# Patient Record
Sex: Male | Born: 1980 | Race: White | Hispanic: No | Marital: Single | Smoking: Current every day smoker
Health system: Southern US, Community
[De-identification: ages and names within clinical notes are randomized; demographics above are authoritative.]

## PROBLEM LIST (undated history)

## (undated) DIAGNOSIS — Z789 Other specified health status: Secondary | ICD-10-CM

## (undated) HISTORY — PX: FRACTURE SURGERY: SHX138

---

## 2004-07-16 ENCOUNTER — Emergency Department: Payer: Self-pay | Admitting: Emergency Medicine

## 2004-07-29 ENCOUNTER — Emergency Department: Payer: Self-pay | Admitting: Emergency Medicine

## 2005-07-07 ENCOUNTER — Emergency Department: Payer: Self-pay | Admitting: General Practice

## 2005-11-18 ENCOUNTER — Emergency Department: Payer: Self-pay | Admitting: Emergency Medicine

## 2005-12-03 ENCOUNTER — Emergency Department: Payer: Self-pay | Admitting: Emergency Medicine

## 2006-04-01 ENCOUNTER — Emergency Department: Payer: Self-pay | Admitting: Emergency Medicine

## 2006-04-04 ENCOUNTER — Emergency Department: Payer: Self-pay | Admitting: Unknown Physician Specialty

## 2007-08-16 ENCOUNTER — Emergency Department: Payer: Self-pay | Admitting: Emergency Medicine

## 2007-12-24 ENCOUNTER — Emergency Department: Payer: Self-pay | Admitting: Emergency Medicine

## 2007-12-26 ENCOUNTER — Emergency Department: Payer: Self-pay | Admitting: Emergency Medicine

## 2009-01-24 ENCOUNTER — Emergency Department: Payer: Self-pay | Admitting: Internal Medicine

## 2009-01-31 ENCOUNTER — Emergency Department: Payer: Self-pay | Admitting: Internal Medicine

## 2009-06-11 ENCOUNTER — Emergency Department: Payer: Self-pay | Admitting: Emergency Medicine

## 2009-10-16 ENCOUNTER — Emergency Department: Payer: Self-pay | Admitting: Emergency Medicine

## 2009-10-18 ENCOUNTER — Emergency Department: Payer: Self-pay | Admitting: Unknown Physician Specialty

## 2010-08-16 ENCOUNTER — Emergency Department: Payer: Self-pay | Admitting: Emergency Medicine

## 2011-04-20 ENCOUNTER — Emergency Department: Payer: Self-pay | Admitting: Emergency Medicine

## 2011-04-20 LAB — CBC
HGB: 15.7 g/dL (ref 13.0–18.0)
MCH: 30.5 pg (ref 26.0–34.0)
MCHC: 33.4 g/dL (ref 32.0–36.0)
MCV: 91 fL (ref 80–100)
Platelet: 253 10*3/uL (ref 150–440)
RBC: 5.16 10*6/uL (ref 4.40–5.90)
RDW: 12.4 % (ref 11.5–14.5)
WBC: 8.2 10*3/uL (ref 3.8–10.6)

## 2011-04-20 LAB — COMPREHENSIVE METABOLIC PANEL
Anion Gap: 11 (ref 7–16)
BUN: 14 mg/dL (ref 7–18)
Calcium, Total: 9.1 mg/dL (ref 8.5–10.1)
Creatinine: 0.74 mg/dL (ref 0.60–1.30)
EGFR (African American): >60
Glucose: 113 mg/dL — ABNORMAL HIGH (ref 65–99)
Osmolality: 284 (ref 275–301)
Potassium: 4.2 mmol/L (ref 3.5–5.1)
Sodium: 142 mmol/L (ref 136–145)
Total Protein: 7.8 g/dL (ref 6.4–8.2)

## 2011-04-20 LAB — URINALYSIS, COMPLETE
Bacteria: NONE SEEN
Blood: NEGATIVE
Glucose,UR: NEGATIVE mg/dL (ref 0–75)
Ketone: NEGATIVE
Specific Gravity: 1.008 (ref 1.003–1.030)
Squamous Epithelial: NONE SEEN

## 2011-06-21 ENCOUNTER — Emergency Department: Payer: Self-pay | Admitting: Emergency Medicine

## 2011-07-25 ENCOUNTER — Emergency Department: Payer: Self-pay | Admitting: Emergency Medicine

## 2011-09-09 ENCOUNTER — Emergency Department: Payer: Self-pay | Admitting: Emergency Medicine

## 2011-09-09 ENCOUNTER — Ambulatory Visit: Payer: Self-pay | Admitting: Orthopaedic Surgery

## 2011-09-11 ENCOUNTER — Emergency Department: Payer: Self-pay | Admitting: Emergency Medicine

## 2011-11-21 ENCOUNTER — Emergency Department: Payer: Self-pay | Admitting: Emergency Medicine

## 2012-04-26 ENCOUNTER — Emergency Department: Payer: Self-pay | Admitting: Emergency Medicine

## 2012-04-26 LAB — CBC WITH DIFFERENTIAL/PLATELET
Basophil #: 0 10*3/uL (ref 0.0–0.1)
Lymphocyte #: 0.9 10*3/uL — ABNORMAL LOW (ref 1.0–3.6)
Lymphocyte %: 11.5 %
MCH: 31 pg (ref 26.0–34.0)
MCHC: 34.6 g/dL (ref 32.0–36.0)
Neutrophil #: 6.2 10*3/uL (ref 1.4–6.5)
Neutrophil %: 80.7 %
Platelet: 161 10*3/uL (ref 150–440)
WBC: 7.7 10*3/uL (ref 3.8–10.6)

## 2012-04-26 LAB — COMPREHENSIVE METABOLIC PANEL
Albumin: 3.8 g/dL (ref 3.4–5.0)
Alkaline Phosphatase: 88 U/L (ref 50–136)
Anion Gap: 5 — ABNORMAL LOW (ref 7–16)
BUN: 11 mg/dL (ref 7–18)
Calcium, Total: 8.4 mg/dL — ABNORMAL LOW (ref 8.5–10.1)
Chloride: 104 mmol/L (ref 98–107)
Co2: 28 mmol/L (ref 21–32)
EGFR (Non-African Amer.): >60
Osmolality: 273 (ref 275–301)
Potassium: 3.8 mmol/L (ref 3.5–5.1)
SGOT(AST): 22 U/L (ref 15–37)
SGPT (ALT): 27 U/L (ref 12–78)
Sodium: 137 mmol/L (ref 136–145)
Total Protein: 7 g/dL (ref 6.4–8.2)

## 2012-04-26 LAB — PROTIME-INR
INR: 1.1
Prothrombin Time: 14.1 secs (ref 11.5–14.7)

## 2012-05-08 ENCOUNTER — Emergency Department: Payer: Self-pay | Admitting: Emergency Medicine

## 2012-07-09 ENCOUNTER — Emergency Department: Payer: Self-pay | Admitting: Emergency Medicine

## 2013-05-06 ENCOUNTER — Emergency Department: Payer: Self-pay | Admitting: Emergency Medicine

## 2013-05-06 LAB — URINALYSIS, COMPLETE
BACTERIA: NONE SEEN
Bilirubin,UR: NEGATIVE
Blood: NEGATIVE
Glucose,UR: NEGATIVE mg/dL (ref 0–75)
Ketone: NEGATIVE
LEUKOCYTE ESTERASE: NEGATIVE
NITRITE: NEGATIVE
PH: 7 (ref 4.5–8.0)
Protein: NEGATIVE
RBC, UR: NONE SEEN /HPF (ref 0–5)
SPECIFIC GRAVITY: 1.023 (ref 1.003–1.030)
WBC UR: NONE SEEN /HPF (ref 0–5)

## 2013-06-12 ENCOUNTER — Emergency Department: Payer: Self-pay | Admitting: Emergency Medicine

## 2013-06-12 LAB — URINALYSIS, COMPLETE
BACTERIA: NONE SEEN
BILIRUBIN, UR: NEGATIVE
Blood: NEGATIVE
GLUCOSE, UR: NEGATIVE mg/dL (ref 0–75)
Ketone: NEGATIVE
Nitrite: NEGATIVE
PROTEIN: NEGATIVE
Ph: 5 (ref 4.5–8.0)
RBC,UR: 1 /HPF (ref 0–5)
Specific Gravity: 1.013 (ref 1.003–1.030)
Squamous Epithelial: 1
WBC UR: 1 /HPF (ref 0–5)

## 2013-08-19 ENCOUNTER — Emergency Department: Payer: Self-pay | Admitting: Emergency Medicine

## 2013-08-19 LAB — COMPREHENSIVE METABOLIC PANEL
ALBUMIN: 3.7 g/dL (ref 3.4–5.0)
AST: 11 U/L — AB (ref 15–37)
Alkaline Phosphatase: 70 U/L
Anion Gap: 9 (ref 7–16)
BUN: 12 mg/dL (ref 7–18)
Bilirubin,Total: 0.3 mg/dL (ref 0.2–1.0)
CALCIUM: 8.5 mg/dL (ref 8.5–10.1)
CHLORIDE: 108 mmol/L — AB (ref 98–107)
Co2: 27 mmol/L (ref 21–32)
Creatinine: 0.85 mg/dL (ref 0.60–1.30)
EGFR (African American): >60
GLUCOSE: 95 mg/dL (ref 65–99)
OSMOLALITY: 286 (ref 275–301)
POTASSIUM: 3.4 mmol/L — AB (ref 3.5–5.1)
SGPT (ALT): 21 U/L (ref 12–78)
Sodium: 144 mmol/L (ref 136–145)
TOTAL PROTEIN: 6.8 g/dL (ref 6.4–8.2)

## 2013-08-19 LAB — CBC WITH DIFFERENTIAL/PLATELET
BASOS ABS: 0.1 10*3/uL (ref 0.0–0.1)
BASOS PCT: 1.1 %
EOS ABS: 0.4 10*3/uL (ref 0.0–0.7)
Eosinophil %: 3.4 %
HCT: 40.9 % (ref 40.0–52.0)
HGB: 13.6 g/dL (ref 13.0–18.0)
Lymphocyte #: 4.8 10*3/uL — ABNORMAL HIGH (ref 1.0–3.6)
Lymphocyte %: 41.6 %
MCH: 30.1 pg (ref 26.0–34.0)
MCHC: 33.2 g/dL (ref 32.0–36.0)
MCV: 91 fL (ref 80–100)
MONO ABS: 0.9 x10 3/mm (ref 0.2–1.0)
Monocyte %: 7.7 %
NEUTROS PCT: 46.2 %
Neutrophil #: 5.3 10*3/uL (ref 1.4–6.5)
Platelet: 244 10*3/uL (ref 150–440)
RBC: 4.51 10*6/uL (ref 4.40–5.90)
RDW: 13.8 % (ref 11.5–14.5)
WBC: 11.5 10*3/uL — AB (ref 3.8–10.6)

## 2013-10-16 ENCOUNTER — Encounter (HOSPITAL_COMMUNITY): Payer: Self-pay | Admitting: Emergency Medicine

## 2013-10-16 ENCOUNTER — Emergency Department (HOSPITAL_COMMUNITY)
Admission: EM | Admit: 2013-10-16 | Discharge: 2013-10-16 | Disposition: A | Discharge status: Home / Self Care | Payer: Medicaid Other | Attending: Emergency Medicine | Admitting: Emergency Medicine

## 2013-10-16 ENCOUNTER — Emergency Department (HOSPITAL_COMMUNITY): Payer: Medicaid Other

## 2013-10-16 DIAGNOSIS — M7989 Other specified soft tissue disorders: Secondary | ICD-10-CM | POA: Diagnosis not present

## 2013-10-16 DIAGNOSIS — G8929 Other chronic pain: Secondary | ICD-10-CM | POA: Insufficient documentation

## 2013-10-16 DIAGNOSIS — F17200 Nicotine dependence, unspecified, uncomplicated: Secondary | ICD-10-CM | POA: Insufficient documentation

## 2013-10-16 DIAGNOSIS — M25569 Pain in unspecified knee: Secondary | ICD-10-CM | POA: Insufficient documentation

## 2013-10-16 DIAGNOSIS — M79609 Pain in unspecified limb: Secondary | ICD-10-CM

## 2013-10-16 DIAGNOSIS — M25561 Pain in right knee: Secondary | ICD-10-CM

## 2013-10-16 MED ORDER — OXYCODONE-ACETAMINOPHEN 5-325 MG PO TABS
2.0000 | ORAL_TABLET | Freq: Once | ORAL | Status: AC
  Administered 2013-10-16: 2 via ORAL
  Filled 2013-10-16: qty 2

## 2013-10-16 MED ORDER — OXYCODONE-ACETAMINOPHEN 5-325 MG PO TABS: 1.0000 | ORAL_TABLET | ORAL | Status: DC | PRN

## 2013-10-16 MED ORDER — ONDANSETRON HCL 4 MG PO TABS: 4.0000 mg | ORAL_TABLET | Freq: Four times a day (QID) | ORAL | Status: DC

## 2013-10-16 MED ORDER — ONDANSETRON 4 MG PO TBDP
8.0000 mg | ORAL_TABLET | Freq: Once | ORAL | Status: AC
  Administered 2013-10-16: 8 mg via ORAL
  Filled 2013-10-16: qty 2

## 2013-10-16 NOTE — Progress Notes (Signed)
VASCULAR LAB PRELIMINARY  PRELIMINARY  PRELIMINARY  PRELIMINARY  Right lower extremity venous duplex completed.    Preliminary report: Right:  No evidence of DVT, superficial thrombosis, or Baker's cyst.  Carmie Lanpher, RVS 10/16/2013, 4:15 PM

## 2013-10-16 NOTE — ED Notes (Signed)
Declined W/C at D/C and was escorted to lobby by RN. 

## 2013-10-16 NOTE — Discharge Instructions (Signed)
Edema Edema is an abnormal buildup of fluids. It is more common in your legs and thighs. Painless swelling of the feet and ankles is more likely as a person ages. It also is common in looser skin, like around your eyes. HOME CARE   Keep the affected body part above the level of the heart while lying down.  Do not sit still or stand for a long time.  Do not put anything right under your knees when you lie down.  Do not wear tight clothes on your upper legs.  Exercise your legs to help the puffiness (swelling) go down.  Wear elastic bandages or support stockings as told by your doctor.  A low-salt diet may help lessen the puffiness.  Only take medicine as told by your doctor. GET HELP IF:  Treatment is not working.  You have heart, liver, or kidney disease and notice that your skin looks puffy or shiny.  You have puffiness in your legs that does not get better when you raise your legs.  You have sudden weight gain for no reason. GET HELP RIGHT AWAY IF:   You have shortness of breath or chest pain.  You cannot breathe when you lie down.  You have pain, redness, or warmth in the areas that are puffy.  You have heart, liver, or kidney disease and get edema all of a sudden.  You have a fever and your symptoms get worse all of a sudden. MAKE SURE YOU:   Understand these instructions.  Will watch your condition.  Will get help right away if you are not doing well or get worse. Document Released: 07/06/2007 Document Revised: 01/22/2013 Document Reviewed: 11/09/2012 [facility] Patient Information 2015 [place], [place]. This information is not intended to replace advice given to you by your health care provider. Make sure you discuss any questions you have with your health care provider.  Knee Pain The knee is the complex joint between your thigh and your lower leg. It is made up of bones, tendons, ligaments, and cartilage. The bones that make up the knee are:  The femur in the  thigh.  The tibia and fibula in the lower leg.  The patella or kneecap riding in the groove on the lower femur. CAUSES  Knee pain is a common complaint with many causes. A few of these causes are:  Injury, such as:  A ruptured ligament or tendon injury.  Torn cartilage.  Medical conditions, such as:  Gout  Arthritis  Infections  Overuse, over training, or overdoing a physical activity. Knee pain can be minor or severe. Knee pain can accompany debilitating injury. Minor knee problems often respond well to self-care measures or get well on their own. More serious injuries may need medical intervention or even surgery. SYMPTOMS The knee is complex. Symptoms of knee problems can vary widely. Some of the problems are:  Pain with movement and weight bearing.  Swelling and tenderness.  Buckling of the knee.  Inability to straighten or extend your knee.  Your knee locks and you cannot straighten it.  Warmth and redness with pain and fever.  Deformity or dislocation of the kneecap. DIAGNOSIS  Determining what is wrong may be very straight forward such as when there is an injury. It can also be challenging because of the complexity of the knee. Tests to make a diagnosis may include:  Your caregiver taking a history and doing a physical exam.  Routine X-rays can be used to rule out other problems. X-rays  will not reveal a cartilage tear. Some injuries of the knee can be diagnosed by:  Arthroscopy a surgical technique by which a small video camera is inserted through tiny incisions on the sides of the knee. This procedure is used to examine and repair internal knee joint problems. Tiny instruments can be used during arthroscopy to repair the torn knee cartilage (meniscus).  Arthrography is a radiology technique. A contrast liquid is directly injected into the knee joint. Internal structures of the knee joint then become visible on X-ray film.  An MRI scan is a non X-ray  radiology procedure in which magnetic fields and a computer produce two- or three-dimensional images of the inside of the knee. Cartilage tears are often visible using an MRI scanner. MRI scans have largely replaced arthrography in diagnosing cartilage tears of the knee.  Blood work.  Examination of the fluid that helps to lubricate the knee joint (synovial fluid). This is done by taking a sample out using a needle and a syringe. TREATMENT The treatment of knee problems depends on the cause. Some of these treatments are:  Depending on the injury, proper casting, splinting, surgery, or physical therapy care will be needed.  Give yourself adequate recovery time. Do not overuse your joints. If you begin to get sore during workout routines, back off. Slow down or do fewer repetitions.  For repetitive activities such as cycling or running, maintain your strength and nutrition.  Alternate muscle groups. For example, if you are a weight lifter, work the upper body on one day and the lower body the next.  Either tight or weak muscles do not give the proper support for your knee. Tight or weak muscles do not absorb the stress placed on the knee joint. Keep the muscles surrounding the knee strong.  Take care of mechanical problems.  If you have flat feet, orthotics or special shoes may help. See your caregiver if you need help.  Arch supports, sometimes with wedges on the inner or outer aspect of the heel, can help. These can shift pressure away from the side of the knee most bothered by osteoarthritis.  A brace called an "unloader" brace also may be used to help ease the pressure on the most arthritic side of the knee.  If your caregiver has prescribed crutches, braces, wraps or ice, use as directed. The acronym for this is PRICE. This means protection, rest, ice, compression, and elevation.  Nonsteroidal anti-inflammatory drugs (NSAIDs), can help relieve pain. But if taken immediately after an  injury, they may actually increase swelling. Take NSAIDs with food in your stomach. Stop them if you develop stomach problems. Do not take these if you have a history of ulcers, stomach pain, or bleeding from the bowel. Do not take without your caregiver's approval if you have problems with fluid retention, heart failure, or kidney problems.  For ongoing knee problems, physical therapy may be helpful.  Glucosamine and chondroitin are over-the-counter dietary supplements. Both may help relieve the pain of osteoarthritis in the knee. These medicines are different from the usual anti-inflammatory drugs. Glucosamine may decrease the rate of cartilage destruction.  Injections of a corticosteroid drug into your knee joint may help reduce the symptoms of an arthritis flare-up. They may provide pain relief that lasts a few months. You may have to wait a few months between injections. The injections do have a small increased risk of infection, water retention, and elevated blood sugar levels.  Hyaluronic acid injected into damaged joints may ease  pain and provide lubrication. These injections may work by reducing inflammation. A series of shots may give relief for as long as 6 months.  Topical painkillers. Applying certain ointments to your skin may help relieve the pain and stiffness of osteoarthritis. Ask your pharmacist for suggestions. Many over the-counter products are approved for temporary relief of arthritis pain.  In some countries, doctors often prescribe topical NSAIDs for relief of chronic conditions such as arthritis and tendinitis. A review of treatment with NSAID creams found that they worked as well as oral medications but without the serious side effects. PREVENTION  Maintain a healthy weight. Extra pounds put more strain on your joints.  Get strong, stay limber. Weak muscles are a common cause of knee injuries. Stretching is important. Include flexibility exercises in your workouts.  Be  smart about exercise. If you have osteoarthritis, chronic knee pain or recurring injuries, you may need to change the way you exercise. This does not mean you have to stop being active. If your knees ache after jogging or playing basketball, consider switching to swimming, water aerobics, or other low-impact activities, at least for a few days a week. Sometimes limiting high-impact activities will provide relief.  Make sure your shoes fit well. Choose footwear that is right for your sport.  Protect your knees. Use the proper gear for knee-sensitive activities. Use kneepads when playing volleyball or laying carpet. Buckle your seat belt every time you drive. Most shattered kneecaps occur in car accidents.  Rest when you are tired. SEEK MEDICAL CARE IF:  You have knee pain that is continual and does not seem to be getting better.  SEEK IMMEDIATE MEDICAL CARE IF:  Your knee joint feels hot to the touch and you have a high fever. MAKE SURE YOU:   Understand these instructions.  Will watch your condition.  Will get help right away if you are not doing well or get worse. Document Released: 11/14/2006 Document Revised: 04/11/2011 Document Reviewed: 11/14/2006 [facility] Patient Information 2015 [place], [place]. This information is not intended to replace advice given to you by your health care provider. Make sure you discuss any questions you have with your health care provider.

## 2013-10-16 NOTE — ED Notes (Signed)
Pt reports hx of injury to right knee, return of pain and swelling one week ago. No relief with ibuprofen.

## 2013-10-16 NOTE — ED Provider Notes (Signed)
CSN: [id number]     Arrival date & time 10/16/13  1143 History  This chart was scribed for non-physician practitioner, Mora Bellman, PA-C, working with Audree Camel, MD by Charline Bills, ED Scribe. This patient was seen in room TR05C/TR05C and the patient's care was started at 2:02 PM.   Chief Complaint  Patient presents with  . Knee Pain   The history is provided by the patient. No language interpreter was used.   HPI Comments: Joe Laser. is a 33 y.o. male who presents to the Emergency Department complaining of R knee pain with associated swelling onset 1 week ago. Pt reports R meniscus injury 4 years again but he never followed up. He describes the pain as throbbing. Walking makes his pain worse. He reports associated R foot swelling first noted 2 days ago. He denies re-injury. He also denies chest pain and SOB. No h/o blood clot.  History reviewed. No pertinent past medical history. History reviewed. No pertinent past surgical history. History reviewed. No pertinent family history. History  Substance Use Topics  . Smoking status: Current Every Day Smoker    Types: Cigarettes  . Smokeless tobacco: Not on file  . Alcohol Use: Yes    Review of Systems  Respiratory: Negative for shortness of breath.   Cardiovascular: Positive for chest pain (intermittent) and leg swelling.  Musculoskeletal: Positive for arthralgias.  All other systems reviewed and are negative.  Allergies  Tramadol  Home Medications   Prior to Admission medications   Not on File   Triage Vitals: BP 127/85  Pulse 60  Temp(Src) 98.4 F (36.9 C) (Oral)  Resp 18  SpO2 98% Physical Exam  Nursing note and vitals reviewed. Constitutional: He is oriented to person, place, and time. He appears well-developed and well-nourished. No distress.  HENT:  Head: Normocephalic and atraumatic.  Right Ear: External ear normal.  Left Ear: External ear normal.  Nose: Nose normal.  Eyes: Conjunctivae are  normal.  Neck: Normal range of motion. No tracheal deviation present.  Cardiovascular: Normal rate, regular rhythm and normal heart sounds.   Pulses:      Posterior tibial pulses are 2+ on the right side.  Pulmonary/Chest: Effort normal and breath sounds normal. No stridor.  Abdominal: Soft. He exhibits no distension. There is no tenderness.  Musculoskeletal: Normal range of motion.  Swelling to R lower extremity  Tender to palpation to posterior calf and posterior knee Joint stable. Compartments soft, neurovascularly intact.   Neurological: He is alert and oriented to person, place, and time.  Skin: Skin is warm and dry. He is not diaphoretic.  Psychiatric: He has a normal mood and affect. His behavior is normal.   ED Course  Procedures (including critical care time) DIAGNOSTIC STUDIES: Oxygen Saturation is 98% on RA, normal by my interpretation.    COORDINATION OF CARE: 2:06 PM-Discussed treatment plan which includes XR, [place] and medication for pain with pt at bedside and pt agreed to plan.   Labs Review Labs Reviewed - No data to display  Imaging Review Dg Knee Complete 4 Views Right  10/16/2013   CLINICAL DATA:  Medial right knee pain; history of old injury  EXAM: RIGHT KNEE - COMPLETE 4+ VIEW  COMPARISON:  None.  FINDINGS: The bones of the right knee are adequately mineralized. There is no acute fracture nor dislocation. There is mild beaking of the tibial spines. The joint spaces are well maintained. The patella is normally positioned. There is no joint  effusion.  IMPRESSION: There is no acute bony abnormality of the right knee. Mild degenerative change is present.   Electronically Signed   By: David  [place]   On: 10/16/2013 13:44    EKG Interpretation None      MDM   Final diagnoses:  Right knee pain  Right leg swelling    Patient presents to ED with right knee pain and swelling. No known injury to provoke swelling. Patient does have known meniscus tear. Patient with  normal XR and negative vascular ultrasound. No DVT. Patient likely has injury that he does not remember causing irritation of his meniscus. Patient was given knee sleeve and orthopedic follow up. Discussed reasons to return to ED immediately. Vital signs stable for discharge. Patient / Family / Caregiver informed of clinical course, understand medical decision-making process, and agree with plan.   I personally performed the services described in this documentation, which was scribed in my presence. The recorded information has been reviewed and is accurate.    Mora Bellman, PA-C 10/17/13 [phone]

## 2013-10-19 NOTE — ED Provider Notes (Signed)
Medical screening examination/treatment/procedure(s) were performed by non-physician practitioner and as supervising physician I was immediately available for consultation/collaboration.  Antonin Meininger T Graylen [place], MD 10/19/13 0751 

## 2014-05-20 NOTE — H&P (Signed)
    Subjective/Chief Complaint Right hand pain    History of Present Illness 34 year old male who struck a wall with his right hand and presented to the ED with pain and deformity of the ulnar border of the right hand.  He had no other injuries and no other complaints.  He describes the pain as intense 9/10 and throbbing.    Past History prior pinning of Left 5th Metacarpal fracture   Past Med/Surgical Hx:  Denies medical history:   ALLERGIES:  Toradol: Rash  Tramadol: Hives  Family and Social History:   Family History Non-Contributory    Social History positive  tobacco    + Tobacco Current (within 1 year)    Place of Living Home   Review of Systems:   Subjective/Chief Complaint Right hand pain    Fever/Chills No    Cough No    Sputum No    Abdominal Pain No    Diarrhea No    Constipation No    Nausea/Vomiting No    SOB/DOE No    Chest Pain No    Dysuria No    Tolerating PT No    Tolerating Diet Yes    Medications/Allergies Reviewed Medications/Allergies reviewed   Physical Exam:   GEN no acute distress    HEENT PERRL    NECK supple    RESP normal resp effort    CARD regular rate    EXTR negative cyanosis/clubbing    SKIN skin turgor good    NEURO follows commands    PSYCH A+O to time, place, person    Additional Comments R hand with localized swelling at ulnar border of the hand. No abrasion or laceration Tenderness to palpation over the area of swelling Sensation intact to light touch radial and ulnar border of the 5th digit cap refill < 2sec, warm and well perfused dificult to asses motor function in the 5th digit due to pain and swelling that restrict ROM but appears to have active flex and ext at the PIP and DIP articulcations ROM and scissoring can not be fully evaluated due to patient's inability or unwillingness to attempt full flexion at the MCP joint.  no gross rotational deformity noted.  XR noted to have R 5th MC fracture  with apex dorsal angulation, no apparent articular involvement     Assessment/Admission Diagnosis 34 yr old male with closed Right 5th MC fracture    Plan Discussion with the patient regarding likely cosmetic and potential functional benefit from closed reduction and splinting but the patient has deferred closed reduction at this time despite expressing understanding that if he desires manipulation to correct the overall alignment at a later date, it will likely be more involved and may need operative intervention if he is found to have excessive deformity or rotational deformity that limits appropriate hand function.  The patient has chosen to undergo splinting without closed reduction at this time and will followup with Dr. Ernest PineHooten in approximately one week for further evaluation and treatment as warranted at that time.  Pain control with oral pain meds per ED recommedations NWB to RUE in the splint.   Electronic Signatures: Otilio SaberSikes, Maddix Kliewer V (MD)  (Signed 09-Aug-13 19:52)  Authored: CHIEF COMPLAINT and HISTORY, PAST MEDICAL/SURGIAL HISTORY, ALLERGIES, FAMILY AND SOCIAL HISTORY, REVIEW OF SYSTEMS, PHYSICAL EXAM, ASSESSMENT AND PLAN   Last Updated: 09-Aug-13 19:52 by Otilio SaberSikes, Gianni Mihalik V (MD)

## 2014-06-10 ENCOUNTER — Emergency Department
Admission: EM | Admit: 2014-06-10 | Discharge: 2014-06-10 | Disposition: A | Discharge status: Home / Self Care | Payer: Medicaid Other | Attending: Emergency Medicine | Admitting: Emergency Medicine

## 2014-06-10 ENCOUNTER — Encounter: Payer: Self-pay | Admitting: *Deleted

## 2014-06-10 ENCOUNTER — Emergency Department: Payer: Medicaid Other

## 2014-06-10 DIAGNOSIS — S299XXA Unspecified injury of thorax, initial encounter: Secondary | ICD-10-CM | POA: Insufficient documentation

## 2014-06-10 DIAGNOSIS — Y998 Other external cause status: Secondary | ICD-10-CM | POA: Insufficient documentation

## 2014-06-10 DIAGNOSIS — Y9289 Other specified places as the place of occurrence of the external cause: Secondary | ICD-10-CM | POA: Diagnosis not present

## 2014-06-10 DIAGNOSIS — Y9389 Activity, other specified: Secondary | ICD-10-CM | POA: Diagnosis not present

## 2014-06-10 DIAGNOSIS — M546 Pain in thoracic spine: Secondary | ICD-10-CM

## 2014-06-10 DIAGNOSIS — Z720 Tobacco use: Secondary | ICD-10-CM | POA: Diagnosis not present

## 2014-06-10 MED ORDER — CYCLOBENZAPRINE HCL 5 MG PO TABS: 5.0000 mg | ORAL_TABLET | Freq: Three times a day (TID) | ORAL | Status: DC | PRN

## 2014-06-10 MED ORDER — CYCLOBENZAPRINE HCL 10 MG PO TABS
5.0000 mg | ORAL_TABLET | Freq: Once | ORAL | Status: AC
  Administered 2014-06-10: 5 mg via ORAL

## 2014-06-10 MED ORDER — CYCLOBENZAPRINE HCL 10 MG PO TABS
ORAL_TABLET | ORAL | Status: AC
  Administered 2014-06-10: 5 mg via ORAL
  Filled 2014-06-10: qty 1
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
## 2014-06-10 NOTE — Discharge Instructions (Signed)
Please seek medical attention for any high fevers, chest pain, shortness of breath, change in behavior, persistent vomiting, bloody stool or any other new or concerning symptoms. ° ° °Back Pain, Adult °Low back pain is very common. About 1 in 5 people have back pain. The cause of low back pain is rarely dangerous. The pain often gets better over time. About half of people with a sudden onset of back pain feel better in just 2 weeks. About 8 in 10 people feel better by 6 weeks.  °CAUSES °Some common causes of back pain include: °· Strain of the muscles or ligaments supporting the spine. °· Wear and tear (degeneration) of the spinal discs. °· Arthritis. °· Direct injury to the back. °DIAGNOSIS °Most of the time, the direct cause of low back pain is not known. However, back pain can be treated effectively even when the exact cause of the pain is unknown. Answering your caregiver's questions about your overall health and symptoms is one of the most accurate ways to make sure the cause of your pain is not dangerous. If your caregiver needs more information, he or she may order lab work or imaging tests (X-rays or MRIs). However, even if imaging tests show changes in your back, this usually does not require surgery. °HOME CARE INSTRUCTIONS °For many people, back pain returns. Since low back pain is rarely dangerous, it is often a condition that people can learn to manage on their own.  °· Remain active. It is stressful on the back to sit or stand in one place. Do not sit, drive, or stand in one place for more than 30 minutes at a time. Take short walks on level surfaces as soon as pain allows. Try to increase the length of time you walk each day. °· Do not stay in bed. Resting more than 1 or 2 days can delay your recovery. °· Do not avoid exercise or work. Your body is made to move. It is not dangerous to be active, even though your back may hurt. Your back will likely heal faster if you return to being active before your  pain is gone. °· Pay attention to your body when you  bend and lift. Many people have less discomfort when lifting if they bend their knees, keep the load close to their bodies, and avoid twisting. Often, the most comfortable positions are those that put less stress on your recovering back. °· Find a comfortable position to sleep. Use a firm mattress and lie on your side with your knees slightly bent. If you lie on your back, put a pillow under your knees. °· Only take over-the-counter or prescription medicines as directed by your caregiver. Over-the-counter medicines to reduce pain and inflammation are often the most helpful. Your caregiver may prescribe muscle relaxant drugs. These medicines help dull your pain so you can more quickly return to your normal activities and healthy exercise. °· Put ice on the injured area. °¨ Put ice in a plastic bag. °¨ Place a towel between your skin and the bag. °¨ Leave the ice on for 15-20 minutes, 03-04 times a day for the first 2 to 3 days. After that, ice and heat may be alternated to reduce pain and spasms. °· Ask your caregiver about trying back exercises and gentle massage. This may be of some benefit. °· Avoid feeling anxious or stressed. Stress increases muscle tension and can worsen back pain. It is important to recognize when you are anxious or stressed and learn ways to manage it. Exercise is a great option. °SEEK MEDICAL CARE IF: °· You have pain that is not   relieved with rest or medicine. °· You have pain that does not improve in 1 week. °· You have new symptoms. °· You are generally not feeling well. °SEEK IMMEDIATE MEDICAL CARE IF:  °· You have pain that radiates from your back into your legs. °· You develop new bowel or bladder control problems. °· You have unusual weakness or numbness in your arms or legs. °· You develop nausea or vomiting. °· You develop abdominal pain. °· You feel faint. °Document Released: 01/17/2005 Document Revised: 07/19/2011 Document  Reviewed: 05/21/2013 °[employer]® Patient Information ©2015 [employer]. This information is not intended to replace advice given to you by your health care provider. Make sure you discuss any questions you have with your health care provider. ° °
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
## 2014-09-15 ENCOUNTER — Encounter: Payer: Self-pay | Admitting: *Deleted

## 2014-09-15 ENCOUNTER — Emergency Department
Admission: EM | Admit: 2014-09-15 | Discharge: 2014-09-15 | Disposition: A | Discharge status: Home / Self Care | Payer: Medicaid Other | Attending: Emergency Medicine | Admitting: Emergency Medicine

## 2014-09-15 DIAGNOSIS — W57XXXA Bitten or stung by nonvenomous insect and other nonvenomous arthropods, initial encounter: Secondary | ICD-10-CM | POA: Diagnosis not present

## 2014-09-15 DIAGNOSIS — S80862A Insect bite (nonvenomous), left lower leg, initial encounter: Secondary | ICD-10-CM | POA: Diagnosis not present

## 2014-09-15 DIAGNOSIS — Z79899 Other long term (current) drug therapy: Secondary | ICD-10-CM | POA: Insufficient documentation

## 2014-09-15 DIAGNOSIS — Y9389 Activity, other specified: Secondary | ICD-10-CM | POA: Insufficient documentation

## 2014-09-15 DIAGNOSIS — Y9289 Other specified places as the place of occurrence of the external cause: Secondary | ICD-10-CM | POA: Insufficient documentation

## 2014-09-15 DIAGNOSIS — Y998 Other external cause status: Secondary | ICD-10-CM | POA: Insufficient documentation

## 2014-09-15 DIAGNOSIS — L089 Local infection of the skin and subcutaneous tissue, unspecified: Secondary | ICD-10-CM | POA: Insufficient documentation

## 2014-09-15 DIAGNOSIS — Z720 Tobacco use: Secondary | ICD-10-CM | POA: Insufficient documentation

## 2014-09-15 MED ORDER — OXYCODONE-ACETAMINOPHEN 5-325 MG PO TABS: 1.0000 | ORAL_TABLET | Freq: Four times a day (QID) | ORAL | Status: DC | PRN

## 2014-09-15 MED ORDER — IBUPROFEN 800 MG PO TABS
800.0000 mg | ORAL_TABLET | Freq: Once | ORAL | Status: AC
  Administered 2014-09-15: 800 mg via ORAL
  Filled 2014-09-15: qty 1

## 2014-09-15 MED ORDER — IBUPROFEN 800 MG PO TABS: 800.0000 mg | ORAL_TABLET | Freq: Three times a day (TID) | ORAL | Status: AC | PRN

## 2014-09-15 MED ORDER — SULFAMETHOXAZOLE-TRIMETHOPRIM 800-160 MG PO TABS: 1.0000 | ORAL_TABLET | Freq: Two times a day (BID) | ORAL | Status: DC

## 2014-09-15 MED ORDER — OXYCODONE-ACETAMINOPHEN 5-325 MG PO TABS
1.0000 | ORAL_TABLET | Freq: Once | ORAL | Status: AC
  Administered 2014-09-15: 1 via ORAL
  Filled 2014-09-15: qty 1

## 2014-09-15 NOTE — ED Notes (Signed)
Past few days cc/o small abcess to area below left knee

## 2014-09-15 NOTE — ED Provider Notes (Signed)
[facility] Emergency Department Provider Note  ____________________________________________  Time seen: Approximately 12:37 PM  I have reviewed the triage vital signs and the nursing notes.   HISTORY  Chief Complaint Abscess    HPI Joe Lang. is a 34 y.o. male complaining of pain edema and erythema just below the left knee. Patient believe is secondary to an insect bite. Patient denies any fevers chills associated with this complaint. Patient denies any nausea vomiting or diarrhea. Patient rated his pain discomfort as as a 7/10. No palliative measures taken for this complaint.   History reviewed. No pertinent past medical history.  There are no active problems to display for this patient.   History reviewed. No pertinent past surgical history.  Current Outpatient Rx  Name  Route  Sig  Dispense  Refill  . cyclobenzaprine (FLEXERIL) 5 MG tablet   Oral   Take 1 tablet (5 mg total) by mouth every 8 (eight) hours as needed for muscle spasms.   20 tablet   0   . ibuprofen (ADVIL,MOTRIN) 200 MG tablet   Oral   Take 600 mg by mouth every 6 (six) hours as needed.         Marland Kitchen ibuprofen (ADVIL,MOTRIN) 800 MG tablet   Oral   Take 1 tablet (800 mg total) by mouth every 8 (eight) hours as needed for moderate pain.   15 tablet   0   . ondansetron (ZOFRAN) 4 MG tablet   Oral   Take 1 tablet (4 mg total) by mouth every 6 (six) hours.   12 tablet   0   . oxyCODONE-acetaminophen (PERCOCET/ROXICET) 5-325 MG per tablet   Oral   Take 1 tablet by mouth every 4 (four) hours as needed for severe pain. May take 2 tablets PO q 6 hours for severe pain - Do not take with Tylenol as this tablet already contains tylenol   15 tablet   0   . oxyCODONE-acetaminophen (ROXICET) 5-325 MG per tablet   Oral   Take 1 tablet by mouth every 6 (six) hours as needed for moderate pain.   8 tablet   0   . sulfamethoxazole-trimethoprim (BACTRIM DS,SEPTRA DS) 800-160  MG per tablet   Oral   Take 1 tablet by mouth 2 (two) times daily.   20 tablet   0     Allergies Tramadol  No family history on file.  Social History Social History  Substance Use Topics  . Smoking status: Current Every Day Smoker    Types: Cigarettes  . Smokeless tobacco: None  . Alcohol Use: No    Review of Systems Constitutional: No fever/chills Eyes: No visual changes. ENT: No sore throat. Cardiovascular: Denies chest pain. Respiratory: Denies shortness of breath. Gastrointestinal: No abdominal pain.  No nausea, no vomiting.  No diarrhea.  No constipation. Genitourinary: Negative for dysuria. Musculoskeletal: Negative for back pain. Skin: Edema erythema left lower leg. Neurological: Negative for headaches, focal weakness or numbness. 10-point ROS otherwise negative.  ____________________________________________   PHYSICAL EXAM:  VITAL SIGNS: ED Triage Vitals  Enc Vitals Group     BP 09/15/14 1140 131/97 mmHg     Pulse Rate 09/15/14 1140 76     Resp 09/15/14 1140 20     Temp 09/15/14 1140 98.4 F (36.9 C)     Temp Source 09/15/14 1140 Oral     SpO2 09/15/14 1140 100 %     Weight 09/15/14 1140 152 lb (68.947 kg)  Height 09/15/14 1140  (1.727 m)     Head Cir --      Peak Flow --      Pain Score 09/15/14 1142 7     Pain Loc --      Pain Edu? --      Excl. in GC? --     Constitutional: Alert and oriented. Well appearing and in no acute distress. Eyes: Conjunctivae are normal. PERRL. EOMI. Head: Atraumatic. Nose: No congestion/rhinnorhea. Mouth/Throat: Mucous membranes are moist.  Oropharynx non-erythematous. Neck: No stridor.  No cervical spine tenderness to palpation. Hematological/Lymphatic/Immunilogical: No cervical lymphadenopathy. Cardiovascular: Normal rate, regular rhythm. Grossly normal heart sounds.  Good peripheral circulation. Respiratory: Normal respiratory effort.  No retractions. Lungs CTAB. Gastrointestinal: Soft and  nontender. No distention. No abdominal bruits. No CVA tenderness. Musculoskeletal: No lower extremity tenderness nor edema.  No joint effusions. Neurologic:  Normal speech and language. No gross focal neurologic deficits are appreciated. No gait instability. Skin: Papular lesion on erythematous base. No discharge. Area is nonfluctuant. Psychiatric: Mood and affect are normal. Speech and behavior are normal.  ____________________________________________   LABS (all labs ordered are listed, but only abnormal results are displayed)  Labs Reviewed - No data to display ____________________________________________  EKG   ____________________________________________  RADIOLOGY   ____________________________________________   PROCEDURES  Procedure(s) performed: None  Critical Care performed: No  ____________________________________________   INITIAL IMPRESSION / ASSESSMENT AND PLAN / ED COURSE  Pertinent labs & imaging results that were available during my care of the patient were reviewed by me and considered in my medical decision making (see chart for details).  Skin infection secondary to insect bite. ____________________________________________   FINAL CLINICAL IMPRESSION(S) / ED DIAGNOSES  Final diagnoses:  Infected insect bite of left leg, initial encounter      Joni Reining, PA-C 09/15/14 1240  Emily Filbert, MD 09/17/14 [phone]

## 2014-09-15 NOTE — ED Notes (Signed)
abcess to left knee area

## 2014-09-21 ENCOUNTER — Inpatient Hospital Stay
Admission: EM | Admit: 2014-09-21 | Discharge: 2014-09-24 | DRG: 872 | Disposition: A | Discharge status: Home / Self Care | Payer: Medicaid Other | Attending: Internal Medicine | Admitting: Internal Medicine

## 2014-09-21 DIAGNOSIS — M7041 Prepatellar bursitis, right knee: Secondary | ICD-10-CM | POA: Diagnosis present

## 2014-09-21 DIAGNOSIS — L03115 Cellulitis of right lower limb: Secondary | ICD-10-CM | POA: Diagnosis present

## 2014-09-21 DIAGNOSIS — Z800 Family history of malignant neoplasm of digestive organs: Secondary | ICD-10-CM | POA: Diagnosis not present

## 2014-09-21 DIAGNOSIS — L0390 Cellulitis, unspecified: Secondary | ICD-10-CM

## 2014-09-21 DIAGNOSIS — W57XXXA Bitten or stung by nonvenomous insect and other nonvenomous arthropods, initial encounter: Secondary | ICD-10-CM | POA: Diagnosis present

## 2014-09-21 DIAGNOSIS — M25561 Pain in right knee: Secondary | ICD-10-CM

## 2014-09-21 DIAGNOSIS — F17210 Nicotine dependence, cigarettes, uncomplicated: Secondary | ICD-10-CM | POA: Diagnosis present

## 2014-09-21 DIAGNOSIS — M7040 Prepatellar bursitis, unspecified knee: Secondary | ICD-10-CM | POA: Diagnosis present

## 2014-09-21 DIAGNOSIS — A419 Sepsis, unspecified organism: Secondary | ICD-10-CM | POA: Diagnosis present

## 2014-09-21 DIAGNOSIS — M25461 Effusion, right knee: Secondary | ICD-10-CM

## 2014-09-21 DIAGNOSIS — Z886 Allergy status to analgesic agent status: Secondary | ICD-10-CM | POA: Diagnosis not present

## 2014-09-21 HISTORY — DX: Other specified health status: Z78.9

## 2014-09-21 LAB — CBC WITH DIFFERENTIAL/PLATELET
BASOS ABS: 0.1 10*3/uL (ref 0–0.1)
BASOS PCT: 0 %
EOS ABS: 0.1 10*3/uL (ref 0–0.7)
Eosinophils Relative: 1 %
HEMATOCRIT: 41.8 % (ref 40.0–52.0)
HEMOGLOBIN: 13.8 g/dL (ref 13.0–18.0)
Lymphocytes Relative: 10 %
Lymphs Abs: 2 10*3/uL (ref 1.0–3.6)
MCH: 29.9 pg (ref 26.0–34.0)
MCHC: 33 g/dL (ref 32.0–36.0)
MCV: 90.5 fL (ref 80.0–100.0)
MONOS PCT: 7 %
Monocytes Absolute: 1.4 10*3/uL — ABNORMAL HIGH (ref 0.2–1.0)
NEUTROS ABS: 16.2 10*3/uL — AB (ref 1.4–6.5)
Neutrophils Relative %: 82 %
Platelets: 282 10*3/uL (ref 150–440)
RBC: 4.62 MIL/uL (ref 4.40–5.90)
RDW: 13.3 % (ref 11.5–14.5)
WBC: 19.8 10*3/uL — AB (ref 3.8–10.6)

## 2014-09-21 LAB — URINALYSIS COMPLETE WITH MICROSCOPIC (ARMC ONLY)
BILIRUBIN URINE: NEGATIVE
Bacteria, UA: NONE SEEN
GLUCOSE, UA: NEGATIVE mg/dL
Hgb urine dipstick: NEGATIVE
KETONES UR: NEGATIVE mg/dL
Leukocytes, UA: NEGATIVE
NITRITE: NEGATIVE
Protein, ur: NEGATIVE mg/dL
SPECIFIC GRAVITY, URINE: 1.015 (ref 1.005–1.030)
pH: 6 (ref 5.0–8.0)

## 2014-09-21 LAB — COMPREHENSIVE METABOLIC PANEL
ALK PHOS: 79 U/L (ref 38–126)
ALT: 14 U/L — AB (ref 17–63)
ANION GAP: 10 (ref 5–15)
AST: 18 U/L (ref 15–41)
Albumin: 4.3 g/dL (ref 3.5–5.0)
BUN: 13 mg/dL (ref 6–20)
CALCIUM: 9 mg/dL (ref 8.9–10.3)
CO2: 25 mmol/L (ref 22–32)
CREATININE: 0.84 mg/dL (ref 0.61–1.24)
Chloride: 106 mmol/L (ref 101–111)
Glucose, Bld: 103 mg/dL — ABNORMAL HIGH (ref 65–99)
Potassium: 3.5 mmol/L (ref 3.5–5.1)
SODIUM: 141 mmol/L (ref 135–145)
TOTAL PROTEIN: 7.8 g/dL (ref 6.5–8.1)
Total Bilirubin: 0.3 mg/dL (ref 0.3–1.2)

## 2014-09-21 LAB — LACTIC ACID, PLASMA: Lactic Acid, Venous: 1 mmol/L (ref 0.5–2.0)

## 2014-09-21 MED ORDER — ONDANSETRON HCL 4 MG PO TABS: 4.0000 mg | ORAL_TABLET | Freq: Four times a day (QID) | ORAL | Status: DC | PRN

## 2014-09-21 MED ORDER — HYDROMORPHONE HCL 1 MG/ML IJ SOLN
1.0000 mg | INTRAMUSCULAR | Status: DC | PRN
  Administered 2014-09-21: 1 mg via INTRAVENOUS
  Administered 2014-09-21: 0.5 mg via INTRAVENOUS
  Administered 2014-09-22 – 2014-09-24 (×5): 1 mg via INTRAVENOUS
  Filled 2014-09-21 (×6): qty 1

## 2014-09-21 MED ORDER — ACETAMINOPHEN 325 MG PO TABS: 650.0000 mg | ORAL_TABLET | Freq: Four times a day (QID) | ORAL | Status: DC | PRN

## 2014-09-21 MED ORDER — ACETAMINOPHEN 650 MG RE SUPP: 650.0000 mg | Freq: Four times a day (QID) | RECTAL | Status: DC | PRN

## 2014-09-21 MED ORDER — VANCOMYCIN HCL IN DEXTROSE 1-5 GM/200ML-% IV SOLN
1000.0000 mg | Freq: Three times a day (TID) | INTRAVENOUS | Status: DC
Start: 2014-09-22 — End: 2014-09-23
  Administered 2014-09-22 – 2014-09-23 (×4): 1000 mg via INTRAVENOUS
  Filled 2014-09-21 (×5): qty 200

## 2014-09-21 MED ORDER — VANCOMYCIN HCL IN DEXTROSE 1-5 GM/200ML-% IV SOLN
1000.0000 mg | Freq: Once | INTRAVENOUS | Status: AC
  Administered 2014-09-21: 1000 mg via INTRAVENOUS
  Filled 2014-09-21: qty 200

## 2014-09-21 MED ORDER — SODIUM CHLORIDE 0.9 % IV SOLN: 0.5000 mg/h | INTRAVENOUS | Status: DC

## 2014-09-21 MED ORDER — OXYCODONE HCL 5 MG PO TABS
5.0000 mg | ORAL_TABLET | ORAL | Status: DC | PRN
  Administered 2014-09-21 – 2014-09-24 (×8): 5 mg via ORAL
  Filled 2014-09-21 (×9): qty 1

## 2014-09-21 MED ORDER — SODIUM CHLORIDE 0.9 % IJ SOLN
3.0000 mL | Freq: Two times a day (BID) | INTRAMUSCULAR | Status: DC
  Administered 2014-09-21 – 2014-09-24 (×3): 3 mL via INTRAVENOUS

## 2014-09-21 MED ORDER — HYDROMORPHONE HCL 1 MG/ML IJ SOLN
INTRAMUSCULAR | Status: AC
  Administered 2014-09-21: 0.5 mg via INTRAVENOUS
  Filled 2014-09-21: qty 1

## 2014-09-21 MED ORDER — PIPERACILLIN-TAZOBACTAM 3.375 G IVPB
3.3750 g | Freq: Three times a day (TID) | INTRAVENOUS | Status: DC
  Administered 2014-09-21 – 2014-09-24 (×8): 3.375 g via INTRAVENOUS
  Filled 2014-09-21 (×12): qty 50

## 2014-09-21 MED ORDER — DEXTROSE 5 % IV SOLN
1.0000 g | Freq: Once | INTRAVENOUS | Status: DC
  Filled 2014-09-21: qty 10

## 2014-09-21 MED ORDER — IBUPROFEN 400 MG PO TABS
800.0000 mg | ORAL_TABLET | Freq: Three times a day (TID) | ORAL | Status: DC | PRN
  Administered 2014-09-22 (×2): 800 mg via ORAL
  Filled 2014-09-21 (×2): qty 2

## 2014-09-21 MED ORDER — PIPERACILLIN-TAZOBACTAM 3.375 G IVPB
3.3750 g | Freq: Three times a day (TID) | INTRAVENOUS | Status: DC
  Filled 2014-09-21 (×2): qty 50

## 2014-09-21 MED ORDER — ENOXAPARIN SODIUM 40 MG/0.4ML ~~LOC~~ SOLN
40.0000 mg | SUBCUTANEOUS | Status: DC
  Administered 2014-09-22 – 2014-09-23 (×3): 40 mg via SUBCUTANEOUS
  Filled 2014-09-21 (×3): qty 0.4

## 2014-09-21 MED ORDER — ONDANSETRON HCL 4 MG/2ML IJ SOLN: 4.0000 mg | Freq: Four times a day (QID) | INTRAMUSCULAR | Status: DC | PRN

## 2014-09-21 MED ORDER — SODIUM CHLORIDE 0.9 % IV SOLN
INTRAVENOUS | Status: DC
  Administered 2014-09-21 – 2014-09-23 (×2): via INTRAVENOUS

## 2014-09-21 NOTE — ED Notes (Signed)
According to EMS, pt c/o right knee wound, swelling, redness, and pain. Pt arrives to ED A+OX4. Pt states he had this same abscess appear on left knee and was treated x2 weeks ago here for this.   EMS VITALS 132/100 10799% T-100.2

## 2014-09-21 NOTE — H&P (Signed)
[facility] [place] at [facility]   PATIENT NAME: Joe Lang    MR#:  [id number]  DATE OF BIRTH:  18-Feb-1980  DATE OF ADMISSION:  09/21/2014  PRIMARY CARE PHYSICIAN: No primary care provider on file.   REQUESTING/REFERRING PHYSICIAN: Darnelle Catalan, MD  CHIEF COMPLAINT:   Chief Complaint  Patient presents with  . Wound Infection    RIGHT KNEE    HISTORY OF PRESENT ILLNESS:  Salathiel Ferrara  is a 34 y.o. male who presents with cellulitis and prepatellar swelling of the right knee. Patient states that about 2 weeks ago he developed some inflammation and cellulitis just distal to his left knee. He came to the ED for evaluation and was told that it was likely an infected insect bite. He was sent home with by mouth ibuprofen and Bactrim. He states that several days later, thick greenish discharge came from the area of induration, followed by clear drainage for a day or 2. After that he had significant pain relief and his erythema began to improve. 2 days later he began to develop swelling over his right knee, and this morning when he woke up he had a similar appearing central ulceration and some clear discharge. He also stated that he was feeling warm at home, so he called the proximal ED. EMS measured his temperature at 102. In the ED he was found to have a white count of 19 and significant cellulitis around the right knee. Hospitalists were called for admission for sepsis due to cellulitis having failed outpatient antibiotics.  PAST MEDICAL HISTORY:   Past Medical History  Diagnosis Date  . Patient denies medical problems     PAST SURGICAL HISTORY:   Past Surgical History  Procedure Laterality Date  . Fracture surgery      Boxing Fractures Surgeries Bilaterally    SOCIAL HISTORY:   Social History  Substance Use Topics  . Smoking status: Current Every Day Smoker    Types: Cigarettes  . Smokeless tobacco: Never Used  . Alcohol Use: No    FAMILY  HISTORY:   Family History  Problem Relation Age of Onset  . Pancreatic cancer Father   . Cirrhosis Sister   . Cirrhosis Brother     DRUG ALLERGIES:   Allergies  Allergen Reactions  . Tramadol Nausea Only  . Toradol [Ketorolac Tromethamine] Rash    MEDICATIONS AT HOME:   Prior to Admission medications   Medication Sig Start Date End Date Taking? Authorizing Provider  ibuprofen (ADVIL,MOTRIN) 800 MG tablet Take 1 tablet (800 mg total) by mouth every 8 (eight) hours as needed for moderate pain. 09/15/14  Yes Joni Reining, PA-C  sulfamethoxazole-trimethoprim (BACTRIM DS,SEPTRA DS) 800-160 MG per tablet Take 1 tablet by mouth 2 (two) times daily. 09/15/14  Yes Joni Reining, PA-C    REVIEW OF SYSTEMS:  Review of Systems  Constitutional: Positive for fever. Negative for chills, weight loss and malaise/fatigue.  HENT: Negative for ear pain, hearing loss and tinnitus.   Eyes: Negative for blurred vision, double vision, pain and redness.  Respiratory: Negative for cough, hemoptysis and shortness of breath.   Cardiovascular: Negative for chest pain, palpitations, orthopnea and leg swelling.  Gastrointestinal: Negative for nausea, vomiting, abdominal pain, diarrhea and constipation.  Genitourinary: Negative for dysuria, frequency and hematuria.  Musculoskeletal: Positive for joint pain (significant right knee pain). Negative for back pain and neck pain.  Skin:       Left anterior shin nodule, right anterior knee induration  and erythema. No acne, rash.  Neurological: Negative for dizziness, tremors, focal weakness and weakness.  Endo/Heme/Allergies: Negative for polydipsia. Does not bruise/bleed easily.  Psychiatric/Behavioral: Negative for depression. The patient is not nervous/anxious and does not have insomnia.      VITAL SIGNS:   Filed Vitals:   09/21/14 1930 09/21/14 2000 09/21/14 2030 09/21/14 2100  BP: 129/88 131/83 128/79 128/82  Pulse: 86 86 83 83  Temp:      TempSrc:       Resp:      SpO2: 98% 97% 97% 97%   Wt Readings from Last 3 Encounters:  09/15/14 68.947 kg (152 lb)  06/10/14 73.483 kg (162 lb)    PHYSICAL EXAMINATION:  Physical Exam  Vitals reviewed. Constitutional: He is oriented to person, place, and time. He appears well-developed and well-nourished. No distress.  HENT:  Head: Normocephalic and atraumatic.  Mouth/Throat: Oropharynx is clear and moist.  Eyes: Conjunctivae and EOM are normal. Pupils are equal, round, and reactive to light. No scleral icterus.  Neck: Normal range of motion. Neck supple. No JVD present. No thyromegaly present.  Cardiovascular: Normal rate, regular rhythm and intact distal pulses.  Exam reveals no gallop and no friction rub.   No murmur heard. Respiratory: Effort normal and breath sounds normal. No respiratory distress. He has no wheezes. He has no rales.  GI: Soft. Bowel sounds are normal. He exhibits no distension. There is no tenderness.  Musculoskeletal: He exhibits tenderness (right knee). He exhibits no edema.  Right knee range of motion severely limited due to pain. No significant joint effusion, does have some patellar and prepatellar swelling and induration in the area of cellulitis. Central ulceration with some purulent looking material, though no frank drainage.   Lymphadenopathy:    He has no cervical adenopathy.  Neurological: He is alert and oriented to person, place, and time. No cranial nerve deficit.  No dysarthria, no aphasia  Skin: Skin is warm and dry. No rash noted. There is erythema (Right knee erythema spreading distally to his upper shin).  Psychiatric: He has a normal mood and affect. His behavior is normal. Judgment and thought content normal.    LABORATORY PANEL:   CBC  Recent Labs Lab 09/21/14 1907  WBC 19.8*  HGB 13.8  HCT 41.8  PLT 282   ------------------------------------------------------------------------------------------------------------------  Chemistries    Recent Labs Lab 09/21/14 1907  NA 141  K 3.5  CL 106  CO2 25  GLUCOSE 103*  BUN 13  CREATININE 0.84  CALCIUM 9.0  AST 18  ALT 14*  ALKPHOS 79  BILITOT 0.3   ------------------------------------------------------------------------------------------------------------------  Cardiac Enzymes No results for input(s): TROPONINI in the last 168 hours. ------------------------------------------------------------------------------------------------------------------  RADIOLOGY:  No results found.  EKG:   Orders placed or performed in visit on 10/18/09  . EKG 12-Lead    IMPRESSION AND PLAN:  Principal Problem:   Sepsis -  Meets criteria with fever and elevated white count, broad-spectrum antibiotics started in the ED, blood culture sent from the ED, UA ordered, lactic acid ordered, hemodynamically stable, standard maintenance fluids overnight Active Problems:   Cellulitis of right knee -  Per report, bedside ultrasound in the ED did not show any significant abscess , likely has  Subsequent prepatellar bursitis due to the local inflammation , see treatment of this below. Broad-spectrum antibiotics ordered. No significant knee joint effusion noted. Pain control ordered.   Prepatellar bursitis -  Secondary to local tissue inflammation from his cellulitis , will use high-dose  ibuprofen to help treat inflammation.  All the records are reviewed and case discussed with ED provider. Management plans discussed with the patient and/or family.  DVT PROPHYLAXIS: SubQ lovenox  ADMISSION STATUS: Inpatient  CODE STATUS:  full  TOTAL TIME TAKING CARE OF THIS PATIENT:  45 minutes.    Esty Ahuja FIELDING 09/21/2014, 9:08 PM  [facility]  Office  [phone]  CC: Primary care physician; No primary care provider on file.

## 2014-09-21 NOTE — ED Provider Notes (Signed)
[facility] Emergency Department Provider Note  ____________________________________________  Time seen: Approximately 8:36 PM  I have reviewed the triage vital signs and the nursing notes.   HISTORY  Chief Complaint Wound Infection    HPI Joe Centola. is a 34 y.o. male patient reports he was seen within the week for insect bite on the left leg. This was opened some pus was drained and is from Bactrim and sent home. That got much better however several days ago the left knee develops similar redness something looked like a pimple this got bigger and bigger and is now draining from the left knee in the whole knee Area and the front of the leg is red swollen tender and warm. Patient reports she has a fever. He reports it hurts to bend the knee.  Past Medical History  Diagnosis Date  . Patient denies medical problems     Patient Active Problem List   Diagnosis Date Noted  . Sepsis 09/21/2014  . Cellulitis of right knee 09/21/2014  . Prepatellar bursitis 09/21/2014    Past Surgical History  Procedure Laterality Date  . Fracture surgery      Boxing Fractures Surgeries Bilaterally    Current Outpatient Rx  Name  Route  Sig  Dispense  Refill  . ibuprofen (ADVIL,MOTRIN) 800 MG tablet   Oral   Take 1 tablet (800 mg total) by mouth every 8 (eight) hours as needed for moderate pain.   15 tablet   0   . sulfamethoxazole-trimethoprim (BACTRIM DS,SEPTRA DS) 800-160 MG per tablet   Oral   Take 1 tablet by mouth 2 (two) times daily.   20 tablet   0     Allergies Tramadol and Toradol  Family History  Problem Relation Age of Onset  . Pancreatic cancer Father   . Cirrhosis Sister   . Cirrhosis Brother     Social History Social History  Substance Use Topics  . Smoking status: Current Every Day Smoker    Types: Cigarettes  . Smokeless tobacco: Never Used  . Alcohol Use: No    Review of Systems Constitutional: Patient complains of fever  and chills Eyes: No visual changes. ENT: No sore throat. Cardiovascular: Denies chest pain. Respiratory: Denies shortness of breath. Gastrointestinal: No abdominal pain. no vomiting.  No diarrhea.  No constipation. Genitourinary: Negative for dysuria. Musculoskeletal: Negative for back pain. Skin: Redness and swelling of the knee And anterior part of the right leg Neurological: Negative for headaches, focal weakness or numbness.  10-point ROS otherwise negative.  ____________________________________________   PHYSICAL EXAM:  VITAL SIGNS: ED Triage Vitals  Enc Vitals Group     BP 09/21/14 1857 144/98 mmHg     Pulse Rate 09/21/14 1857 94     Resp 09/21/14 1857 22     Temp 09/21/14 1857 100.1 F (37.8 C)     Temp Source 09/21/14 1857 Oral     SpO2 09/21/14 1856 99 %     Weight --      Height --      Head Cir --      Peak Flow --      Pain Score 09/21/14 1901 10     Pain Loc --      Pain Edu? --      Excl. in GC? --     Constitutional: Alert and oriented. Well appearing and in no acute distress. Eyes: Conjunctivae are normal. PERRL. EOMI. Head: Atraumatic. Nose: No congestion/rhinnorhea. Mouth/Throat: Mucous membranes  are moist.  Oropharynx non-erythematous. Neck: No stridor. Cardiovascular: Normal rate, regular rhythm. Grossly normal heart sounds.  Good peripheral circulation. Respiratory: Normal respiratory effort.  No retractions. Lungs CTAB. Gastrointestinal: Soft and nontender. No distention. No abdominal bruits. No CVA tenderness. Musculoskeletal: Left leg appears normal. Right patellar area is red swollen and tender and warm. There are 3 small pustules developing in one draining area adjacent to each other. The anterior part of the lower leg is red swollen warm and tender. There is no calf redness swelling tenderness or pain. Ultrasound was done of the patellar area and no appreciable collection of fluid was seen.  No joint effusions by exam or by ultrasound in the  ER  Neurologic:  Normal speech and language. No gross focal neurologic deficits are appreciated. No gait instability. Skin:  Skin is warm, dry and intact. No rash noted. Psychiatric: Mood and affect are normal. Speech and behavior are normal.  ____________________________________________   LABS (all labs ordered are listed, but only abnormal results are displayed)  Labs Reviewed  COMPREHENSIVE METABOLIC PANEL - Abnormal; Notable for the following:    Glucose, Bld 103 (*)    ALT 14 (*)    All other components within normal limits  CBC WITH DIFFERENTIAL/PLATELET - Abnormal; Notable for the following:    WBC 19.8 (*)    Neutro Abs 16.2 (*)    Monocytes Absolute 1.4 (*)    All other components within normal limits  CULTURE, BLOOD (ROUTINE X 2)  CULTURE, BLOOD (ROUTINE X 2)  CBC  CREATININE, SERUM  URINALYSIS COMPLETEWITH MICROSCOPIC ([facility] ONLY)  BASIC METABOLIC PANEL  CBC  LACTIC ACID, PLASMA   ____________________________________________  EKG  ____________________________________________  RADIOLOGY   ____________________________________________   PROCEDURES    ____________________________________________   INITIAL IMPRESSION / ASSESSMENT AND PLAN / ED COURSE  Pertinent labs & imaging results that were available during my care of the patient were reviewed by me and considered in my medical decision making (see chart for details).   ____________________________________________   FINAL CLINICAL IMPRESSION(S) / ED DIAGNOSES  Final diagnoses:  Prepatellar bursitis, right  Cellulitis, unspecified cellulitis site, unspecified extremity site, unspecified laterality      Arnaldo Natal, MD 09/21/14 2221

## 2014-09-21 NOTE — Progress Notes (Signed)
ANTIBIOTIC CONSULT NOTE - INITIAL  Pharmacy Consult for Zosyn/Vanc Indication: rule out sepsis  Allergies  Allergen Reactions  . Tramadol Nausea Only  . Toradol [Ketorolac Tromethamine] Rash    Patient Measurements:   Adjusted Body Weight: 68.9 kg  Vital Signs: Temp: 99.6 F (37.6 C) (08/21 2117) Temp Source: Oral (08/21 2117) BP: 128/82 mmHg (08/21 2127) Pulse Rate: 84 (08/21 2127) Intake/Output from previous day:   Intake/Output from this shift:    Labs:  Recent Labs  09/21/14 1907  WBC 19.8*  HGB 13.8  PLT 282  CREATININE 0.84   Estimated Creatinine Clearance: 122.1 mL/min (by C-G formula based on Cr of 0.84). No results for input(s): VANCOTROUGH, VANCOPEAK, VANCORANDOM, GENTTROUGH, GENTPEAK, GENTRANDOM, TOBRATROUGH, TOBRAPEAK, TOBRARND, AMIKACINPEAK, AMIKACINTROU, AMIKACIN in the last 72 hours.   Microbiology: No results found for this or any previous visit (from the past 720 hour(s)).  Medical History: Past Medical History  Diagnosis Date  . Patient denies medical problems     Medications:  Infusions:  . sodium chloride    . piperacillin-tazobactam (ZOSYN)  IV 3.375 g (09/21/14 2120)   Assessment: 33 yom cc right knee wound infection started 2 weeks ago was sent home on oral Bactrim. Infection worsened and when he noticed fever he came back (tonight's presentation with temperature 102).   Goal of Therapy:  Vancomycin trough level 15-20 mcg/ml  Plan:  Expected duration 7 days with resolution of temperature and/or normalization of WBC Measure antibiotic drug levels at steady state Follow up culture results  Started Zosyn 3.375 gm IV Q8H EI and vancomycin 1 gm IV Q8H with stacked dosing will order level before fourth dose and adjust as needed to maintain trough 15 to 20 mcg/ml.   Carola Frost, Pharm.D. Clinical Pharmacist 09/21/2014,10:12 PM

## 2014-09-22 ENCOUNTER — Inpatient Hospital Stay: Payer: Medicaid Other

## 2014-09-22 LAB — CBC
HEMATOCRIT: 35.4 % — AB (ref 40.0–52.0)
HEMOGLOBIN: 11.8 g/dL — AB (ref 13.0–18.0)
MCH: 30.1 pg (ref 26.0–34.0)
MCHC: 33.5 g/dL (ref 32.0–36.0)
MCV: 90.1 fL (ref 80.0–100.0)
Platelets: 234 10*3/uL (ref 150–440)
RBC: 3.93 MIL/uL — ABNORMAL LOW (ref 4.40–5.90)
RDW: 13.3 % (ref 11.5–14.5)
WBC: 19 10*3/uL — ABNORMAL HIGH (ref 3.8–10.6)

## 2014-09-22 LAB — BASIC METABOLIC PANEL
ANION GAP: 7 (ref 5–15)
BUN: 12 mg/dL (ref 6–20)
CHLORIDE: 106 mmol/L (ref 101–111)
CO2: 25 mmol/L (ref 22–32)
Calcium: 8.2 mg/dL — ABNORMAL LOW (ref 8.9–10.3)
Creatinine, Ser: 0.75 mg/dL (ref 0.61–1.24)
GFR calc Af Amer: >60 mL/min (ref >60)
GLUCOSE: 121 mg/dL — AB (ref 65–99)
POTASSIUM: 3.4 mmol/L — AB (ref 3.5–5.1)
Sodium: 138 mmol/L (ref 135–145)

## 2014-09-22 NOTE — Progress Notes (Addendum)
[facility] - [place] at [facility]   PATIENT NAME: Joe Lang    MR#:  [id number]  DATE OF BIRTH:  Oct 12, 1980  SUBJECTIVE: A 34 year old male patient admitted for right knee cellulitis. Patient was given Bactrim as an outpatient, because of persistent drainage and fever of 102 Fahrenheit he came to the ER and the patient white count was elevated up to 19 the. Admitted to hospitalist service for sepsis with failed outpatient therapy.   CHIEF COMPLAINT:   Chief Complaint  Patient presents with  . Wound Infection    RIGHT KNEE    REVIEW OF SYSTEMS:    ROS  Nutrition: * Tolerating Diet: Tolerating PT:      DRUG ALLERGIES:   Allergies  Allergen Reactions  . Tramadol Nausea Only  . Toradol [Ketorolac Tromethamine] Rash    VITALS:  Blood pressure 114/69, pulse 62, temperature 97.9 F (36.6 C), temperature source Oral, resp. rate 16, height 5\' 9"  (1.753 m), weight 73.71 kg (162 lb 8 oz), SpO2 99 %.  PHYSICAL EXAMINATION:   Physical Exam  GENERAL:  35 y.o.-year-old patient lying in the bed with no acute distress.  EYES: Pupils equal, round, reactive to light and accommodation. No scleral icterus. Extraocular muscles intact.  HEENT: Head atraumatic, normocephalic. Oropharynx and nasopharynx clear.  NECK:  Supple, no jugular venous distention. No thyroid enlargement, no tenderness.  LUNGS: Normal breath sounds bilaterally, no wheezing, rales,rhonchi or crepitation. No use of accessory muscles of respiration.  CARDIOVASCULAR: S1, S2 normal. No murmurs, rubs, or gallops.  ABDOMEN: Soft, nontender, nondistended. Bowel sounds present. No organomegaly or mass.  EXTREMITIES:right anterior  Knee induration,erythema,pus drainage. NEUROLOGIC: Cranial nerves II through XII are intact. Muscle strength 5/5 in all extremities. Sensation intact. Gait not checked.  PSYCHIATRIC: The patient is alert and oriented x 3.  SKIN: No obvious rash, lesion, or ulcer.     LABORATORY PANEL:   CBC  Recent Labs Lab 09/22/14 0316  WBC 19.0*  HGB 11.8*  HCT 35.4*  PLT 234   ------------------------------------------------------------------------------------------------------------------  Chemistries   Recent Labs Lab 09/21/14 1907 09/22/14 0316  NA 141 138  K 3.5 3.4*  CL 106 106  CO2 25 25  GLUCOSE 103* 121*  BUN 13 12  CREATININE 0.84 0.75  CALCIUM 9.0 8.2*  AST 18  --   ALT 14*  --   ALKPHOS 79  --   BILITOT 0.3  --    ------------------------------------------------------------------------------------------------------------------  Cardiac Enzymes No results for input(s): TROPONINI in the last 168 hours. ------------------------------------------------------------------------------------------------------------------  RADIOLOGY:  No results found.   ASSESSMENT AND PLAN:   Principal Problem:   Sepsis Active Problems:   Cellulitis of right knee   Prepatellar bursitis  #1 sepsis secondary to right knee cellulitis: Continue to follow blood cultures, continue IV antibiotics.. Right knee ultrasound showed subsequent patellar bursitis. We will order the right knee x-rays. Continue vancomycin, Zosyn. Paroxysmal no evidence of abscess. Use warm compressions., Continue pain medicine. #2 history of tobacco abuse counseled, no wheezing at this time. DVT prophylaxis.  EKG changes: Patient had bradycardia, ST elevations noted on telemetry. Patient never had chest pain. Remains stable. Repeat EKG is done and patient did not have any ST-T changes. EKG showed normal sinus rhythm at 70 bpm. Patient does not have any ST elevation MI at this time. No chest pain. He does not need any further workup. All the records are reviewed and case discussed with Care Management/Social Workerr. Management plans discussed with the patient, family  and they are in agreement.  CODE STATUS:full   TOTAL TIME TAKING CARE OF THIS PATIENT: 35 minutes.    POSSIBLE D/C IN 1-2DAYS, DEPENDING ON CLINICAL CONDITION.   Katha Hamming M.D on 09/22/2014 at 10:00 AM  Between 7am to 6pm - Pager - [phone]  After 6pm go to [email or web address] - password [email or web address] [facility]  [place] [facility]  Office  [phone]  CC: Primary care physician; No primary care provider on file.

## 2014-09-22 NOTE — Progress Notes (Signed)
RN called to notify pt noted to have bradycardia with likely ST elevation on tele monitoring. Pt asymptomatic, vs stable. Advised to get 12 lead ekg, call back with tracing. Monitor pt, call back if any sx meanwhile.

## 2014-09-22 NOTE — Consult Note (Signed)
MD paged

## 2014-09-23 LAB — VANCOMYCIN, TROUGH: Vancomycin Tr: 10 ug/mL (ref 10–20)

## 2014-09-23 LAB — CBC
HEMATOCRIT: 36.2 % — AB (ref 40.0–52.0)
HEMOGLOBIN: 12 g/dL — AB (ref 13.0–18.0)
MCH: 30.2 pg (ref 26.0–34.0)
MCHC: 33.3 g/dL (ref 32.0–36.0)
MCV: 90.9 fL (ref 80.0–100.0)
Platelets: 259 10*3/uL (ref 150–440)
RBC: 3.98 MIL/uL — ABNORMAL LOW (ref 4.40–5.90)
RDW: 13.3 % (ref 11.5–14.5)
WBC: 15.8 10*3/uL — AB (ref 3.8–10.6)

## 2014-09-23 MED ORDER — SODIUM CHLORIDE 0.9 % IV SOLN
1250.0000 mg | Freq: Three times a day (TID) | INTRAVENOUS | Status: DC
  Administered 2014-09-23 – 2014-09-24 (×3): 1250 mg via INTRAVENOUS
  Filled 2014-09-23 (×7): qty 1250

## 2014-09-23 NOTE — Progress Notes (Signed)
ANTIBIOTIC CONSULT NOTE - INITIAL  Pharmacy Consult for Zosyn/Vanc Indication: rule out sepsis  Allergies  Allergen Reactions  . Tramadol Nausea Only  . Toradol [Ketorolac Tromethamine] Rash    Patient Measurements: Height:  (175.3 cm) Weight: 162 lb 8 oz (73.71 kg) IBW/kg (Calculated) : 70.7 Adjusted Body Weight: 68.9 kg  Vital Signs: Temp: 98.6 F (37 C) (08/22 2355) Temp Source: Oral (08/22 2355) BP: 116/67 mmHg (08/22 2355) Pulse Rate: 72 (08/22 2355) Intake/Output from previous day: 08/22 0701 - 08/23 0700 In: 1220 [P.O.:1220] Out: 2050 [Urine:2050] Intake/Output from this shift: Total I/O In: -  Out: 500 [Urine:500]  Labs:  Recent Labs  09/21/14 1907 09/22/14 0316  WBC 19.8* 19.0*  HGB 13.8 11.8*  PLT 282 234  CREATININE 0.84 0.75   Estimated Creatinine Clearance: 132.6 mL/min (by C-G formula based on Cr of 0.75).  Recent Labs  09/23/14 0249  [facility] 10     Microbiology: Recent Results (from the past 720 hour(s))  Culture, blood (routine x 2)     Status: None (Preliminary result)   Collection Time: 09/21/14  7:07 PM  Result Value Ref Range Status   Specimen Description BLOOD LEFT ARM  Final   Special Requests BOTTLES DRAWN AEROBIC AND ANAEROBIC 5CC  Final   Culture NO GROWTH < 12 HOURS  Final   Report Status PENDING  Incomplete  Culture, blood (routine x 2)     Status: None (Preliminary result)   Collection Time: 09/21/14  7:34 PM  Result Value Ref Range Status   Specimen Description BLOOD LEFT ARM  Final   Special Requests BOTTLES DRAWN AEROBIC AND ANAEROBIC 5CC  Final   Culture NO GROWTH < 12 HOURS  Final   Report Status PENDING  Incomplete    Medical History: Past Medical History  Diagnosis Date  . Patient denies medical problems     Medications:  Infusions:  . sodium chloride 75 mL/hr at 09/21/14 2255   Assessment: 33 yom cc right knee wound infection started 2 weeks ago was sent home on oral Bactrim. Infection  worsened and when he noticed fever he came back (tonight's presentation with temperature 102).   Goal of Therapy:  Vancomycin trough level 15-20 mcg/ml  Plan:  Expected duration 7 days with resolution of temperature and/or normalization of WBC Measure antibiotic drug levels at steady state Follow up culture results  Started Zosyn 3.375 gm IV Q8H EI and vancomycin 1 gm IV Q8H with stacked dosing will order level before fourth dose and adjust as needed to maintain trough 15 to 20 mcg/ml.   8/23 0230 vanc trough 10. Changed to 1250 mg q 8 hours. Level before 4th new dose.  Kyrstyn Greear S, Pharm.D. Clinical Pharmacist 09/23/2014,3:42 AM

## 2014-09-23 NOTE — Progress Notes (Signed)
Patient still has right knee pain. And started to have some drainage from the right knee. [facility] [place] at [facility]   PATIENT NAME: Joe Lang    MR#:  [id number]  DATE OF BIRTH:  [birth year]  SUBJECTIVE: A 34 year old male patient admitted for right knee cellulitis. Patient was given Bactrim as an outpatient, because of persistent drainage and fever of 102 Fahrenheit he came to the ER and the patient white count was elevated up to 19 the. Admitted to hospitalist service for sepsis with failed outpatient therapy. Patient still has right knee pain, noted to have some drainage. Afebrile.   CHIEF COMPLAINT:   Chief Complaint  Patient presents with  . Wound Infection    RIGHT KNEE    REVIEW OF SYSTEMS:    Review of Systems  Constitutional: Negative for fever and chills.  HENT: Negative for hearing loss.   Eyes: Negative for blurred vision, double vision and photophobia.  Respiratory: Negative for cough, hemoptysis and shortness of breath.   Cardiovascular: Negative for palpitations, orthopnea and leg swelling.  Gastrointestinal: Negative for vomiting, abdominal pain and diarrhea.  Genitourinary: Negative for dysuria and urgency.  Musculoskeletal: Negative for myalgias and neck pain.       Right knee pain  Skin: Negative for rash.  Neurological: Negative for dizziness, focal weakness, seizures, weakness and headaches.  Psychiatric/Behavioral: Negative for memory loss. The patient does not have insomnia.     Nutrition:  Tolerating Diet: Tolerating PT:      DRUG ALLERGIES:   Allergies  Allergen Reactions  . Tramadol Nausea Only  . Toradol [Ketorolac Tromethamine] Rash    VITALS:  Blood pressure 128/81, pulse 76, temperature 98.6 F (37 C), temperature source Oral, resp. rate 16, height 5\' 9"  (1.753 m), weight 73.71 kg (162 lb 8 oz), SpO2 99 %.  PHYSICAL EXAMINATION:   Physical Exam  GENERAL:  34 y.o.-year-old patient lying in the  bed with no acute distress.  EYES: Pupils equal, round, reactive to light and accommodation. No scleral icterus. Extraocular muscles intact.  HEENT: Head atraumatic, normocephalic. Oropharynx and nasopharynx clear.  NECK:  Supple, no jugular venous distention. No thyroid enlargement, no tenderness.  LUNGS: Normal breath sounds bilaterally, no wheezing, rales,rhonchi or crepitation. No use of accessory muscles of respiration.  CARDIOVASCULAR: S1, S2 normal. No murmurs, rubs, or gallops.  ABDOMEN: Soft, nontender, nondistended. Bowel sounds present. No organomegaly or mass.  EXTREMITIES:right anterior  Knee induration,erythema,pus drainage. NEUROLOGIC: Cranial nerves II through XII are intact. Muscle strength 5/5 in all extremities. Sensation intact. Gait not checked.  PSYCHIATRIC: The patient is alert and oriented x 3.  SKIN: No obvious rash, lesion, or ulcer.    LABORATORY PANEL:   CBC  Recent Labs Lab 09/22/14 0316  WBC 19.0*  HGB 11.8*  HCT 35.4*  PLT 234   ------------------------------------------------------------------------------------------------------------------  Chemistries   Recent Labs Lab 09/21/14 1907 09/22/14 0316  NA 141 138  K 3.5 3.4*  CL 106 106  CO2 25 25  GLUCOSE 103* 121*  BUN 13 12  CREATININE 0.84 0.75  CALCIUM 9.0 8.2*  AST 18  --   ALT 14*  --   ALKPHOS 79  --   BILITOT 0.3  --    ------------------------------------------------------------------------------------------------------------------  Cardiac Enzymes No results for input(s): TROPONINI in the last 168 hours. ------------------------------------------------------------------------------------------------------------------  RADIOLOGY:  Dg Knee 1-2 Views Right  09/22/2014   CLINICAL DATA:  Prepatellar swelling and pain  EXAM: RIGHT KNEE - 1-2 VIEW  COMPARISON:  10/16/2013  FINDINGS: Prepatellar swelling is noted consistent with the patient's given clinical history. No joint  effusion is seen. No bony abnormality is noted.  IMPRESSION: Soft tissue swelling without acute bony abnormality.   Electronically Signed   By: Alcide Clever M.D.   On: 09/22/2014 12:28     ASSESSMENT AND PLAN:   Principal Problem:   Sepsis Active Problems:   Cellulitis of right knee   Prepatellar bursitis  #1 sepsis secondary to right knee cellulitis: Kelly bedtime, check the cultures from the right knee cellulitis drainage. X-ray of the right knee did not show any joint involvement... Right knee ultrasound showed subsequent patellar bursitis.check cbc today Continue vancomycin, Zosyn. For today no evidence of abscess. Use warm compressions., Continue pain medicine. #2 history of tobacco abuse counseled, no wheezing at this time. DVT prophylaxis. Likely discharged tomorrow with  PO  antibiotcs.  EKG changes: Patient had bradycardia, ST elevations noted on telemetry. Patient never had chest pain. Remains stable. Repeat EKG is done and patient did not have any ST-T changes. EKG showed normal sinus rhythm at 70 bpm. Patient does not have any ST elevation MI at this time. No chest pain. He does not need any further workup. All the records are reviewed and case discussed with Care Management/Social Workerr. Management plans discussed with the patient, family and they are in agreement.  CODE STATUS:full   TOTAL TIME TAKING CARE OF THIS PATIENT: 35 minutes.   POSSIBLE D/C IN 1-2DAYS, DEPENDING ON CLINICAL CONDITION.   Katha Hamming M.D on 09/23/2014 at 10:38 AM  Between 7am to 6pm - Pager - [phone]  After 6pm go to [email or web address] - password [email or web address] [facility]  [place] [facility]  Office  [phone]  CC: Primary care physician; No primary care provider on file.

## 2014-09-24 LAB — VANCOMYCIN, TROUGH: VANCOMYCIN TR: 17 ug/mL (ref 10–20)

## 2014-09-24 MED ORDER — SULFAMETHOXAZOLE-TRIMETHOPRIM 800-160 MG PO TABS: 1.0000 | ORAL_TABLET | Freq: Two times a day (BID) | ORAL | Status: DC

## 2014-09-24 MED ORDER — OXYCODONE-ACETAMINOPHEN 5-325 MG PO TABS: 1.0000 | ORAL_TABLET | ORAL | Status: AC | PRN

## 2014-09-24 NOTE — Progress Notes (Signed)
Discharge instructions given per MD order, home and new medications reviewed with patient, rx.slip for oxycodone given to patient. IV site removed left forearm/cath intact.

## 2014-09-24 NOTE — Progress Notes (Signed)
ANTIBIOTIC CONSULT NOTE - FOLLOW UP   Pharmacy Consult for Zosyn/Vanc Indication: rule out sepsis  Allergies  Allergen Reactions  . Tramadol Nausea Only  . Toradol [Ketorolac Tromethamine] Rash    Patient Measurements: Height:  (175.3 cm) Weight: 162 lb 8 oz (73.71 kg) IBW/kg (Calculated) : 70.7 Adjusted Body Weight: 68.9 kg  Vital Signs: Temp: 97.9 F (36.6 C) (08/24 1115) Temp Source: Oral (08/24 1115) BP: 151/96 mmHg (08/24 1115) Pulse Rate: 53 (08/24 1115) Intake/Output from previous day: 08/23 0701 - 08/24 0700 In: 2000 [P.O.:1200; IV Piggyback:800] Out: 3225 [Urine:3225] Intake/Output from this shift: Total I/O In: -  Out: 400 [Urine:400]  Labs:  Recent Labs  09/21/14 1907 09/22/14 0316 09/23/14 0242  WBC 19.8* 19.0* 15.8*  HGB 13.8 11.8* 12.0*  PLT 282 234 259  CREATININE 0.84 0.75  --    Estimated Creatinine Clearance: 132.6 mL/min (by C-G formula based on Cr of 0.75).  Recent Labs  09/23/14 0249 09/24/14 0957  VANCOTROUGH 10 17     Microbiology: Recent Results (from the past 720 hour(s))  Culture, blood (routine x 2)     Status: None (Preliminary result)   Collection Time: 09/21/14  7:07 PM  Result Value Ref Range Status   Specimen Description BLOOD LEFT ARM  Final   Special Requests BOTTLES DRAWN AEROBIC AND ANAEROBIC 5CC  Final   Culture NO GROWTH 3 DAYS  Final   Report Status PENDING  Incomplete  Culture, blood (routine x 2)     Status: None (Preliminary result)   Collection Time: 09/21/14  7:34 PM  Result Value Ref Range Status   Specimen Description BLOOD LEFT ARM  Final   Special Requests BOTTLES DRAWN AEROBIC AND ANAEROBIC 5CC  Final   Culture NO GROWTH 3 DAYS  Final   Report Status PENDING  Incomplete  Wound culture     Status: None (Preliminary result)   Collection Time: 09/23/14 10:07 AM  Result Value Ref Range Status   Specimen Description KNEE  Final   Special Requests NONE  Final   Gram Stain PENDING  Incomplete   Culture   Final    HEAVY GROWTH STAPHYLOCOCCUS AUREUS SUSCEPTIBILITIES TO FOLLOW    Report Status PENDING  Incomplete    Medical History: Past Medical History  Diagnosis Date  . Patient denies medical problems     Medications:  Infusions:    Assessment: 33 yom cc right knee wound infection started 2 weeks ago was sent home on oral Bactrim. Infection worsened and when he noticed fever he came back (tonight's presentation with temperature 102).   Goal of Therapy:  Vancomycin trough level 15-20 mcg/ml  Plan:  Expected duration 7 days with resolution of temperature and/or normalization of WBC Measure antibiotic drug levels at steady state Follow up culture results  Started Zosyn 3.375 gm IV Q8H EI and vancomycin 1 gm IV Q8H with stacked dosing will order level before fourth dose and adjust as needed to maintain trough 15 to 20 mcg/ml.   8/23 0230 vanc trough 10. Changed to 1250 mg q 8 hours. Level before 4th new dose.  8/24: Vancomycin trough level resulted @ 17 mcg/ml. Will continue the same.   Trinette Vera D, Pharm.D. Clinical Pharmacist 09/24/2014,12:21 PM

## 2014-09-24 NOTE — Progress Notes (Signed)
Patient is medically stable for D/C to home today. Per RN patient does not have a ride home. Clinical Education officer, museum (CSW) met with patient to address transportation concerns. Per patient he does not have money to pay for a taxi and he tried to call for a ride but nobody can pick him up. Patient reported that he usually walks everywhere and does not have a car. CSW provided patient with [employer]. Please reconsult if future social work needs arise. CSW signing off.   Blima Rich, [place] [phone]

## 2014-09-26 LAB — CULTURE, BLOOD (ROUTINE X 2)
CULTURE: NO GROWTH
Culture: NO GROWTH

## 2014-09-27 LAB — WOUND CULTURE

## 2014-09-29 NOTE — Discharge Summary (Signed)
Joe Blades., is a 34 y.o. male  DOB 30-Jul-1980  MRN [id number].  Admission date:  09/21/2014  Admitting Physician  Oralia Manis, MD  Discharge Date:  09/24/2014   Primary MD  No primary care provider on file.  Recommendations for primary care physician for things to follow:   Follow-up with her primary doctor in 1 week.   Admission Diagnosis  Prepatellar bursitis, right [M70.41] Cellulitis, unspecified cellulitis site, unspecified extremity site, unspecified laterality [L03.90]   Discharge Diagnosis  Prepatellar bursitis, right [M70.41] Cellulitis, unspecified cellulitis site, unspecified extremity site, unspecified laterality [L03.90]   Principal Problem:   Sepsis Active Problems:   Cellulitis of right knee   Prepatellar bursitis      Past Medical History  Diagnosis Date  . Patient denies medical problems     Past Surgical History  Procedure Laterality Date  . Fracture surgery      Boxing Fractures Surgeries Bilaterally       History of present illness and  Hospital Course:     Kindly see H&P for history of present illness and admission details, please review complete Labs, Consult reports and Test reports for all details in brief  HPI  from the history and physical done on the day of admission 34 year old male patient admitted for right knee cellulitis. Tried Bactrim as an outpatient without any relief. Continue to have swelling and redness of the right knee associated with some drainage. And was given vancomycin, Zosyn. And the admitted to medical service.   Hospital Course   #1 sepsis secondary to right knee cellulitis: Patient received vancomycin and Zosyn. X-ray of the right knee did not show any joint involvement. Patient improved with IV antibiotics, warm compressions. Discharge home with  the Bactrim by mouth. And wound culture showed heavy growth of MRSA. Initial white count was 19 dropped to 15.8. Also was afebrile. Given short supply of  Percocet. 2, Tobacco abuse;counselled.    Discharge Condition: stable   Follow UP      Discharge Instructions  and  Discharge Medications        Medication List    TAKE these medications        ibuprofen 800 MG tablet  Commonly known as:  ADVIL,MOTRIN  Take 1 tablet (800 mg total) by mouth every 8 (eight) hours as needed for moderate pain.     oxyCODONE-acetaminophen 5-325 MG per tablet  Commonly known as:  ROXICET  Take 1 tablet by mouth every 4 (four) hours as needed for severe pain.     sulfamethoxazole-trimethoprim 800-160 MG per tablet  Commonly known as:  BACTRIM DS,SEPTRA DS  Take 1 tablet by mouth 2 (two) times daily.     sulfamethoxazole-trimethoprim 800-160 MG per tablet  Commonly known as:  BACTRIM DS,SEPTRA DS  Take 1 tablet by mouth 2 (two) times daily.          Diet and Activity recommendation: See Discharge Instructions above   Consults obtained ;none   Major procedures and Radiology Reports - PLEASE review detailed and final reports for all details, in brief -      Dg Knee 1-2 Views Right  09/22/2014   CLINICAL DATA:  Prepatellar swelling and pain  EXAM: RIGHT KNEE - 1-2 VIEW  COMPARISON:  10/16/2013  FINDINGS: Prepatellar swelling is noted consistent with the patient's given clinical history. No joint effusion is seen. No bony abnormality is noted.  IMPRESSION: Soft tissue swelling without acute bony abnormality.   Electronically Signed  By: Alcide Clever M.D.   On: 09/22/2014 12:28    Micro Results   Recent Results (from the past 240 hour(s))  Culture, blood (routine x 2)     Status: None   Collection Time: 09/21/14  7:07 PM  Result Value Ref Range Status   Specimen Description BLOOD LEFT ARM  Final   Special Requests BOTTLES DRAWN AEROBIC AND ANAEROBIC 5CC  Final   Culture NO  GROWTH 5 DAYS  Final   Report Status 09/26/2014 FINAL  Final  Culture, blood (routine x 2)     Status: None   Collection Time: 09/21/14  7:34 PM  Result Value Ref Range Status   Specimen Description BLOOD LEFT ARM  Final   Special Requests BOTTLES DRAWN AEROBIC AND ANAEROBIC 5CC  Final   Culture NO GROWTH 5 DAYS  Final   Report Status 09/26/2014 FINAL  Final  Wound culture     Status: None   Collection Time: 09/23/14 10:07 AM  Result Value Ref Range Status   Specimen Description KNEE  Final   Special Requests NONE  Final   Gram Stain   Final    MODERATE WBC SEEN FEW GRAM POSITIVE COCCI RARE GRAM VARIABLE ROD    Culture   Final    HEAVY GROWTH METHICILLIN RESISTANT STAPHYLOCOCCUS AUREUS CRITICAL RESULT CALLED TO, READ BACK BY AND VERIFIED WITH: DR. MODY AT 1459 09/25/14 DV    Report Status 09/27/2014 FINAL  Final   Organism ID, Bacteria METHICILLIN RESISTANT STAPHYLOCOCCUS AUREUS  Final      Susceptibility   Methicillin resistant staphylococcus aureus - MIC*    CIPROFLOXACIN Value in next row Resistant      RESISTANT>=8    ERYTHROMYCIN Value in next row Resistant      RESISTANT>=8    GENTAMICIN Value in next row Sensitive      SENSITIVE<=0.5    OXACILLIN Value in next row Resistant      MODERATELY RESISTANT>=4    TETRACYCLINE Value in next row Sensitive      SENSITIVE<=1    VANCOMYCIN Value in next row Sensitive      SENSITIVE1    TRIMETH/SULFA Value in next row Sensitive      SENSITIVE<=10    CLINDAMYCIN Value in next row Sensitive      SENSITIVE<=0.25    CEFOXITIN SCREEN Value in next row Resistant      POSITIVECEFOXITIN SCREEN - This test may be used to predict mecA-mediated oxacillin resistance, and it is based on the cefoxitin disk screen test.  The cefoxitin screen and oxacillin work in combination to determine the final interpretation reported for oxacillin.     Inducible Clindamycin Value in next row Sensitive      POSITIVECEFOXITIN SCREEN - This test may be used  to predict mecA-mediated oxacillin resistance, and it is based on the cefoxitin disk screen test.  The cefoxitin screen and oxacillin work in combination to determine the final interpretation reported for oxacillin.     * HEAVY GROWTH METHICILLIN RESISTANT STAPHYLOCOCCUS AUREUS       Today   Subjective:   Joe Lang today has no headache,no chest abdominal pain,no new weakness tingling or numbness, feels much better wants to go home today.   Objective:   Blood pressure 151/96, pulse 53, temperature 97.9 F (36.6 C), temperature source Oral, resp. rate 18, height 5\' 9"  (1.753 m), weight 73.71 kg (162 lb 8 oz), SpO2 100 %.  No intake or output data in the 24 hours  ending 09/29/14 1247  Exam Awake Alert, Oriented x 3, No new F.N deficits, Normal affect [place].AT,PERRAL Supple Neck,No JVD, No cervical lymphadenopathy appriciated.  Symmetrical Chest wall movement, Good air movement bilaterally, CTAB RRR,No Gallops,Rubs or new Murmurs, No Parasternal Heave +ve B.Sounds, Abd Soft, Non tender, No organomegaly appriciated, No rebound -guarding or rigidity. No Cyanosis, Clubbing or edema, No new Rash or bruise  Data Review   CBC w Diff:  Lab Results  Component Value Date   WBC 15.8* 09/23/2014   WBC 11.5* 08/19/2013   HGB 12.0* 09/23/2014   HGB 13.6 08/19/2013   HCT 36.2* 09/23/2014   HCT 40.9 08/19/2013   PLT 259 09/23/2014   PLT 244 08/19/2013   LYMPHOPCT 10 09/21/2014   LYMPHOPCT 41.6 08/19/2013   MONOPCT 7 09/21/2014   MONOPCT 7.7 08/19/2013   EOSPCT 1 09/21/2014   EOSPCT 3.4 08/19/2013   BASOPCT 0 09/21/2014   BASOPCT 1.1 08/19/2013    CMP:  Lab Results  Component Value Date   NA 138 09/22/2014   NA 144 08/19/2013   K 3.4* 09/22/2014   K 3.4* 08/19/2013   CL 106 09/22/2014   CL 108* 08/19/2013   CO2 25 09/22/2014   CO2 27 08/19/2013   BUN 12 09/22/2014   BUN 12 08/19/2013   CREATININE 0.75 09/22/2014   CREATININE 0.85 08/19/2013   PROT 7.8 09/21/2014    PROT 6.8 08/19/2013   ALBUMIN 4.3 09/21/2014   ALBUMIN 3.7 08/19/2013   BILITOT 0.3 09/21/2014   BILITOT 0.3 08/19/2013   ALKPHOS 79 09/21/2014   ALKPHOS 70 08/19/2013   AST 18 09/21/2014   AST 11* 08/19/2013   ALT 14* 09/21/2014   ALT 21 08/19/2013  .   Total Time in preparing paper work, data evaluation and todays exam - 35 minutes  Justice Aguirre M.D on 09/24/2014 at 12:47 PM

## 2017-03-14 ENCOUNTER — Other Ambulatory Visit: Payer: Self-pay

## 2017-03-14 ENCOUNTER — Emergency Department
Admission: EM | Admit: 2017-03-14 | Discharge: 2017-03-14 | Disposition: A | Discharge status: Home / Self Care | Payer: Self-pay | Attending: Emergency Medicine | Admitting: Emergency Medicine

## 2017-03-14 ENCOUNTER — Emergency Department: Payer: Self-pay

## 2017-03-14 DIAGNOSIS — R1033 Periumbilical pain: Secondary | ICD-10-CM

## 2017-03-14 DIAGNOSIS — F17210 Nicotine dependence, cigarettes, uncomplicated: Secondary | ICD-10-CM | POA: Insufficient documentation

## 2017-03-14 DIAGNOSIS — K5900 Constipation, unspecified: Secondary | ICD-10-CM

## 2017-03-14 LAB — COMPREHENSIVE METABOLIC PANEL
ALK PHOS: 72 U/L (ref 38–126)
ALT: 27 U/L (ref 17–63)
ANION GAP: 8 (ref 5–15)
AST: 22 U/L (ref 15–41)
Albumin: 4.5 g/dL (ref 3.5–5.0)
BUN: 15 mg/dL (ref 6–20)
CALCIUM: 9.5 mg/dL (ref 8.9–10.3)
CO2: 26 mmol/L (ref 22–32)
CREATININE: 0.88 mg/dL (ref 0.61–1.24)
Chloride: 104 mmol/L (ref 101–111)
Glucose, Bld: 113 mg/dL — ABNORMAL HIGH (ref 65–99)
Potassium: 4.4 mmol/L (ref 3.5–5.1)
SODIUM: 138 mmol/L (ref 135–145)
TOTAL PROTEIN: 7.9 g/dL (ref 6.5–8.1)
Total Bilirubin: 0.8 mg/dL (ref 0.3–1.2)

## 2017-03-14 LAB — CBC
HCT: 47.4 % (ref 40.0–52.0)
HEMOGLOBIN: 16.3 g/dL (ref 13.0–18.0)
MCH: 30.8 pg (ref 26.0–34.0)
MCHC: 34.4 g/dL (ref 32.0–36.0)
MCV: 89.4 fL (ref 80.0–100.0)
PLATELETS: 300 10*3/uL (ref 150–440)
RBC: 5.3 MIL/uL (ref 4.40–5.90)
RDW: 13.1 % (ref 11.5–14.5)
WBC: 11.7 10*3/uL — AB (ref 3.8–10.6)

## 2017-03-14 LAB — TYPE AND SCREEN
ABO/RH(D): O POS
Antibody Screen: NEGATIVE

## 2017-03-14 LAB — URINALYSIS, COMPLETE (UACMP) WITH MICROSCOPIC
BILIRUBIN URINE: NEGATIVE
Bacteria, UA: NONE SEEN
GLUCOSE, UA: NEGATIVE mg/dL
HGB URINE DIPSTICK: NEGATIVE
KETONES UR: NEGATIVE mg/dL
LEUKOCYTES UA: NEGATIVE
NITRITE: NEGATIVE
PROTEIN: NEGATIVE mg/dL
RBC / HPF: NONE SEEN RBC/hpf (ref 0–5)
SQUAMOUS EPITHELIAL / LPF: NONE SEEN
Specific Gravity, Urine: 1.012 (ref 1.005–1.030)
pH: 6 (ref 5.0–8.0)

## 2017-03-14 LAB — LIPASE, BLOOD: Lipase: 30 U/L (ref 11–51)

## 2017-03-14 MED ORDER — ONDANSETRON HCL 4 MG/2ML IJ SOLN
4.0000 mg | Freq: Once | INTRAMUSCULAR | Status: AC
  Administered 2017-03-14: 4 mg via INTRAVENOUS
  Filled 2017-03-14: qty 2

## 2017-03-14 MED ORDER — FENTANYL CITRATE (PF) 100 MCG/2ML IJ SOLN
100.0000 ug | Freq: Once | INTRAMUSCULAR | Status: AC
  Administered 2017-03-14: 100 ug via INTRAVENOUS
  Filled 2017-03-14: qty 2

## 2017-03-14 MED ORDER — IOPAMIDOL (ISOVUE-300) INJECTION 61%
100.0000 mL | Freq: Once | INTRAVENOUS | Status: AC | PRN
  Administered 2017-03-14: 100 mL via INTRAVENOUS

## 2017-03-14 NOTE — ED Notes (Signed)
First Nurse Note:  Patient reports dark blood in his stool X 2 days.  NAD at registration.

## 2017-03-14 NOTE — ED Notes (Signed)
Patient returns to [facility].  To Room [facility].

## 2017-03-14 NOTE — ED Notes (Signed)
[facility]

## 2017-03-14 NOTE — ED Notes (Signed)
Pt to ct 

## 2017-03-14 NOTE — Discharge Instructions (Signed)

## 2017-03-14 NOTE — ED Triage Notes (Signed)
To ER via POV c/o mid to left sided abdominal pain for last 2 days. Pt reports dark red bloody stools  X 2 and constipation. Pt reports heavy lifting, unsure if related. Pt alert and oriented X4, active, cooperative, pt in NAD. RR even and unlabored, color WNL.

## 2017-03-14 NOTE — ED Notes (Signed)
Pt presents with abdominal pain x 2 weeks. Began when he lifted something, and has remained "sore." States he has been out of work for 4 days with the pain. Pt states he has a protrusion near his umbilicus that feels better when he puts pressure on it. He also reports dark brownish stool x 2 days, states he has pressure in his lower back when he has a bowel movement. NAD noted.

## 2017-03-14 NOTE — ED Notes (Signed)
First Nurse Note:  Called patient to place in room, patient told PD officer that he was going outside.  Patient not in WR.

## 2017-03-14 NOTE — ED Provider Notes (Signed)
[facility] Emergency Department Provider Note  ____________________________________________  Time seen: Approximately 11:29 AM  I have reviewed the triage vital signs and the nursing notes.   HISTORY  Chief Complaint Abdominal Pain; Rectal Bleeding; and Constipation   HPI Joe Lang. is a 37 y.o. male with no significant PMH who presents for evaluation of rectal bleeding and abdominal pain. Patient reports that he has been having intermittent abdominal pain for over a month and it started after he was lifting something heavy. The pain is located around his belly button, sharp, intermittent, currently 6/10. Occasionally he sees a bulging in that area and gets relief when he pushes it in. There is no bulging today. Patient reports that he has a history of chronic constipation and has to strain when he has a bowel movement. The abdominal pain is usually worse when he is straining. Over the last 2 days is noted a small amount of blood in the stool. He has had one BM a day. He has had history of hemorrhoids in the past. No history of GI bleed, no hemoptysis, no hematemesis, no coffee-ground emesis, no melena. Patient denies NSAID use.No fever, chills, nausea, vomiting, diarrhea, CP, or SOB.  Past Medical History:  Diagnosis Date  . Patient denies medical problems     Patient Active Problem List   Diagnosis Date Noted  . Sepsis (HCC) 09/21/2014  . Cellulitis of right knee 09/21/2014  . Prepatellar bursitis 09/21/2014    Past Surgical History:  Procedure Laterality Date  . FRACTURE SURGERY     Boxing Fractures Surgeries Bilaterally    Prior to Admission medications   Medication Sig Start Date End Date Taking? Authorizing Provider  ibuprofen (ADVIL,MOTRIN) 800 MG tablet Take 1 tablet (800 mg total) by mouth every 8 (eight) hours as needed for moderate pain. Patient not taking: Reported on 03/14/2017 09/15/14   Joni Reining, PA-C    oxyCODONE-acetaminophen (ROXICET) 5-325 MG per tablet Take 1 tablet by mouth every 4 (four) hours as needed for severe pain. Patient not taking: Reported on 03/14/2017 09/24/14   Katha Hamming, MD    Allergies Tramadol and Toradol [ketorolac tromethamine]  Family History  Problem Relation Age of Onset  . Pancreatic cancer Father   . Cirrhosis Sister   . Cirrhosis Brother     Social History Social History   Tobacco Use  . Smoking status: Current Every Day Smoker    Types: Cigarettes  . Smokeless tobacco: Never Used  Substance Use Topics  . Alcohol use: No  . Drug use: No    Review of Systems  Constitutional: Negative for fever. Eyes: Negative for visual changes. ENT: Negative for sore throat. Neck: No neck pain  Cardiovascular: Negative for chest pain. Respiratory: Negative for shortness of breath. Gastrointestinal:+ abdominal pain. No vomiting or diarrhea. Genitourinary: Negative for dysuria. + rectal bleeding Musculoskeletal: Negative for back pain. Skin: Negative for rash. Neurological: Negative for headaches, weakness or numbness. Psych: No SI or HI  ____________________________________________   PHYSICAL EXAM:  VITAL SIGNS: ED Triage Vitals [03/14/17 0937]  Enc Vitals Group     BP (!) 150/99     Pulse Rate 77     Resp 14     Temp 98.3 F (36.8 C)     Temp Source Oral     SpO2 99 %     Weight 150 lb (68 kg)     Height 5\' 8"  (1.727 m)     Head Circumference  Peak Flow      Pain Score 6     Pain Loc      Pain Edu?      Excl. in GC?     Constitutional: Alert and oriented. Well appearing and in no apparent distress. HEENT:      Head: Normocephalic and atraumatic.         Eyes: Conjunctivae are normal. Sclera is non-icteric.       Mouth/Throat: Mucous membranes are moist.       Neck: Supple with no signs of meningismus. Cardiovascular: Regular rate and rhythm. No murmurs, gallops, or rubs. 2+ symmetrical distal pulses are present in all  extremities. No JVD. Respiratory: Normal respiratory effort. Lungs are clear to auscultation bilaterally. No wheezes, crackles, or rhonchi.  Gastrointestinal: Soft, mildly tender to palpation over the L quadrants, and non distended with positive bowel sounds. No rebound or guarding. No evidence of umbilical hernia Genitourinary: No CVA tenderness. Bilateral testicles are descended with no tenderness to palpation, bilateral positive cremasteric reflexes are present, no swelling or erythema of the scrotum. No evidence of inguinal hernia. Musculoskeletal: Nontender with normal range of motion in all extremities. No edema, cyanosis, or erythema of extremities. Neurologic: Normal speech and language. Face is symmetric. Moving all extremities. No gross focal neurologic deficits are appreciated. Skin: Skin is warm, dry and intact. No rash noted. Psychiatric: Mood and affect are normal. Speech and behavior are normal.  ____________________________________________   LABS (all labs ordered are listed, but only abnormal results are displayed)  Labs Reviewed  COMPREHENSIVE METABOLIC PANEL - Abnormal; Notable for the following components:      Result Value   Glucose, Bld 113 (*)    All other components within normal limits  CBC - Abnormal; Notable for the following components:   WBC 11.7 (*)    All other components within normal limits  LIPASE, BLOOD  URINALYSIS, COMPLETE (UACMP) WITH MICROSCOPIC  POC OCCULT BLOOD, ED  TYPE AND SCREEN   ____________________________________________  EKG  none ____________________________________________  RADIOLOGY  Interpreted by me: CT a/p: No acute findigns   Interpretation by Radiologist:  Ct Abdomen Pelvis W Contrast  Result Date: 03/14/2017 CLINICAL DATA:  Abdominal pain for 2 weeks. Pain began after lifting a refrigerator. Dark brownish the stool. EXAM: CT ABDOMEN AND PELVIS WITH CONTRAST TECHNIQUE: Multidetector CT imaging of the abdomen and pelvis  was performed using the standard protocol following bolus administration of intravenous contrast. CONTRAST:  100mL ISOVUE-300 IOPAMIDOL (ISOVUE-300) INJECTION 61% COMPARISON:  CT abdomen and pelvis 08/20/2013 FINDINGS: Lower chest: The lung bases are clear without focal nodule, mass, or airspace disease. Mild dependent atelectasis is similar to the prior studies. Heart size is normal. No significant pleural or pericardial effusion is present. Hepatobiliary: No focal liver abnormality is seen. No gallstones, gallbladder wall thickening, or biliary dilatation. Pancreas: Proximal pancreatic duct dilation is stable. No discrete lesions are present. Spleen: Normal in size without focal abnormality. Adrenals/Urinary Tract: The adrenal glands are normal bilaterally. Kidneys and ureters are within normal limits. The urinary bladder is within normal limits. Stomach/Bowel: The stomach and duodenum are within normal limits. Small bowel is unremarkable. The terminal ileum is identified. Is within normal limits. The appendix is retrocecal. The ascending and transverse colon are within normal limits. Descending and sigmoid colon are normal. Vascular/Lymphatic: No significant vascular findings are present. No enlarged abdominal or pelvic lymph nodes. Reproductive: Prostate gland is within normal limits. Other: No abdominal wall hernia or abnormality. No abdominopelvic ascites. Musculoskeletal:  Bilateral L5 pars defects are present. 6 mm anterolisthesis is present at L5-S1. Moderate foraminal stenosis is present bilaterally. Vertebral body heights and alignment are maintained. Bony pelvis is intact. IMPRESSION: 1. No acute or focal trauma to the abdomen. 2. 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 3. Bilateral L5 pars defects. 4. Grade 1 anterolisthesis at L5-S1. Electronically Signed   By: Marin Roberts M.D.   On: 03/14/2017 12:18       ____________________________________________   PROCEDURES  Procedure(s) performed: None Procedures Critical Care performed:  None ____________________________________________   INITIAL IMPRESSION / ASSESSMENT AND PLAN / ED COURSE   37 y.o. male with no significant PMH who presents for evaluation of 2 days of small amount of rectal bleeding and 1 month of intermittent periumbilical abdominal pain. On exam patient is well-appearing, in no distress, normal vital signs, abdomen is soft with no palpable hernias, he does have mild left quadrant tenderness with no rebound or guarding, GU exam is normal, rectal exam showing no stool in the rectal vault, no hemorrhoids, no blood. Labs are within normal limits with normal hemoglobin of 16.3, mild leukocytosis of 11.7. Normal CMP, lipase, and UA. CT a/p pending to rule out internal hernia, diverticulitis, appendicitis.     _________________________ 1:05 PM on 03/14/2017 -----------------------------------------  CT scan with no acute findings. At this time patient's can be discharged home with follow-up with primary care doctor. Discussed return precautions for recurrence of bleeding or new/ worsening abdominal pain. Patient has been observed for several hours in the emergency room with no further episodes of bleeding per rectum. At this point he only has had 2 bowel movements total in 2 days with normal hgb and no evidence of active bleeding.    As part of my medical decision making, I reviewed the following data within the electronic MEDICAL RECORD NUMBER Nursing notes reviewed and incorporated, Labs reviewed , Radiograph reviewed , Notes from prior ED visits and [place] Controlled Substance Database    Pertinent labs & imaging results that were available during my care of the patient were reviewed by me and considered in my medical decision making (see chart for details).    ____________________________________________   FINAL CLINICAL IMPRESSION(S)  / ED DIAGNOSES  Final diagnoses:  Constipation, unspecified constipation type  Periumbilical abdominal pain      NEW MEDICATIONS STARTED DURING THIS VISIT:  ED Discharge Orders    None       Note:  This document was prepared using Dragon voice recognition software and may include unintentional dictation errors.    Don Perking, [place], MD 03/14/17 [phone]

## 2017-03-14 NOTE — ED Notes (Signed)
Pt discharged home after verbalizing understanding of discharge instructions; nad noted. 

## 2019-01-19 IMAGING — CT CT ABD-PELV W/ CM
2 of 5 series · 15 of 46 positions shown, 17 images · IV contrast (APPLIED)
Comparison: CT abdomen and pelvis 08/20/2013

CLINICAL DATA: Abdominal pain for 2 weeks. Pain began after lifting
a refrigerator. Dark brownish the stool.

EXAM:
CT ABDOMEN AND PELVIS WITH CONTRAST
TECHNIQUE: Multidetector CT imaging of the abdomen and pelvis was performed
using the standard protocol following bolus administration of
intravenous contrast.
CONTRAST:  100mL [id number]-[id number] IOPAMIDOL ([id number]-[id number]) INJECTION 61%

[Series 2: routine abd/pel with · axial · 0.71mm/px · z∈[-998,-568]mm · 12 of 98 slices shown, 14 images]
[im 6/98  soft-tissue]
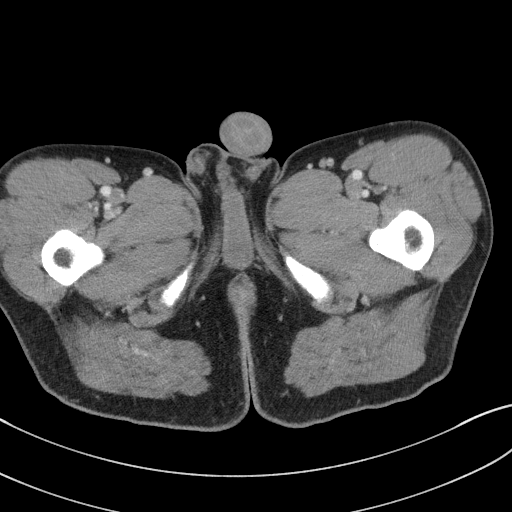
[im 6/98  bone]
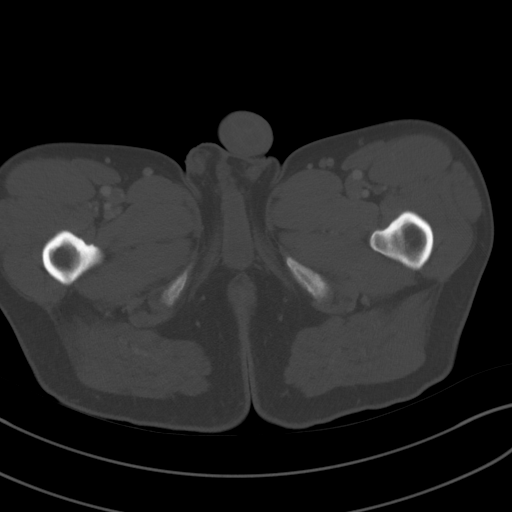
[im 16/98  soft-tissue]
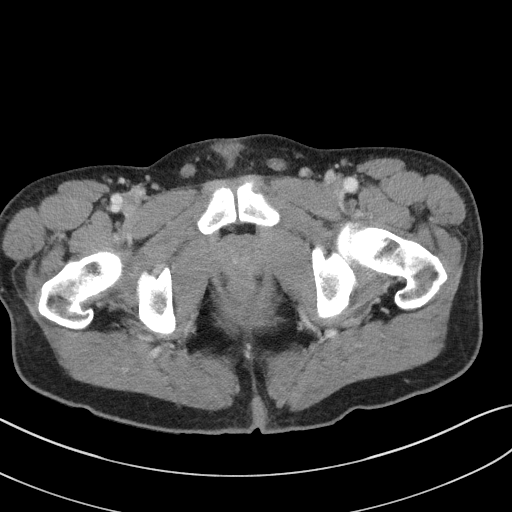
[im 21/98  soft-tissue]
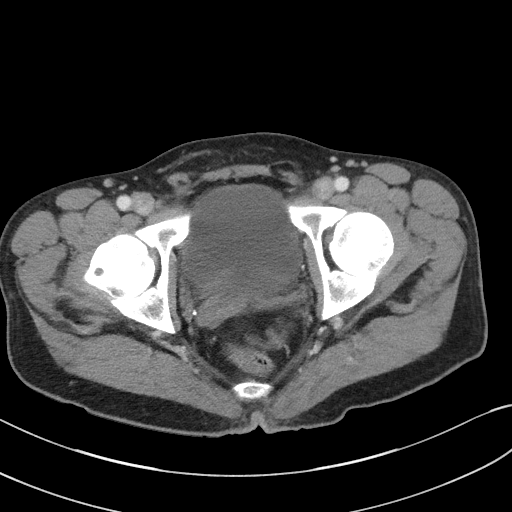
[im 31/98  soft-tissue]
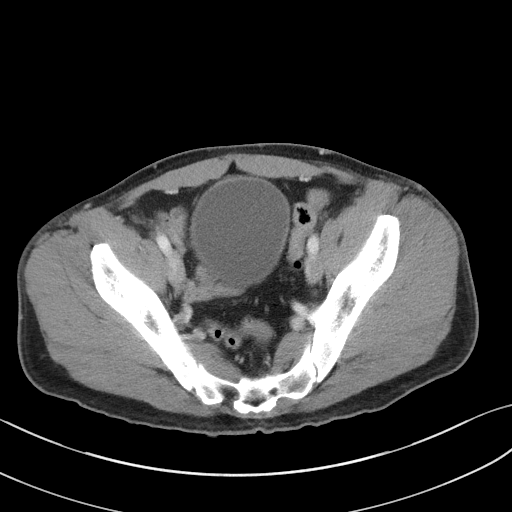
[im 36/98  soft-tissue]
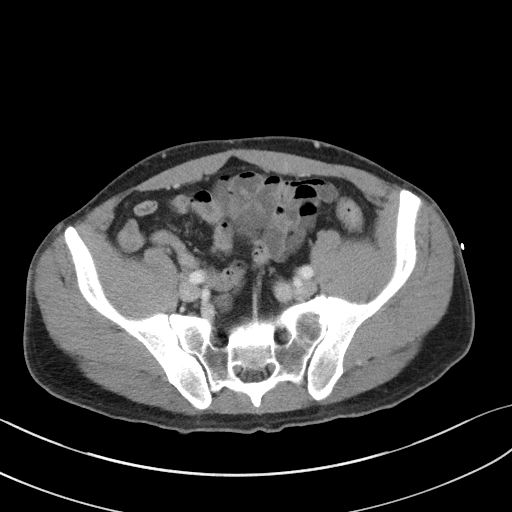
[im 46/98  soft-tissue]
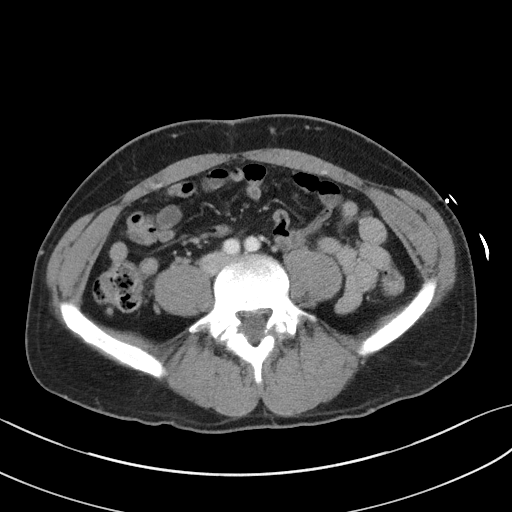
[im 52/98  soft-tissue]
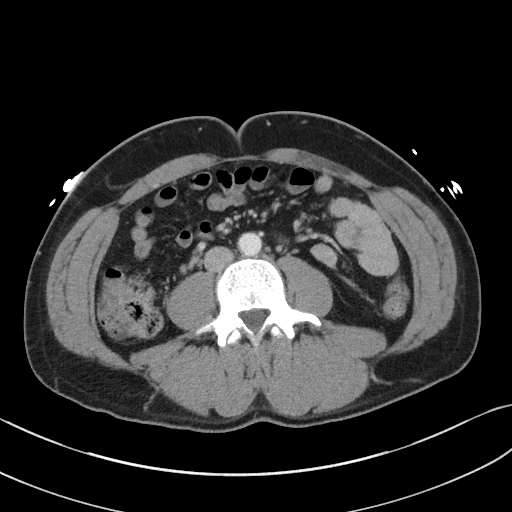
[im 62/98  soft-tissue]
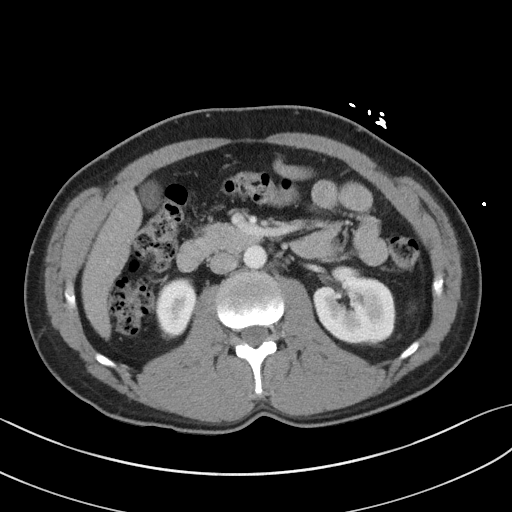
[im 67/98  soft-tissue]
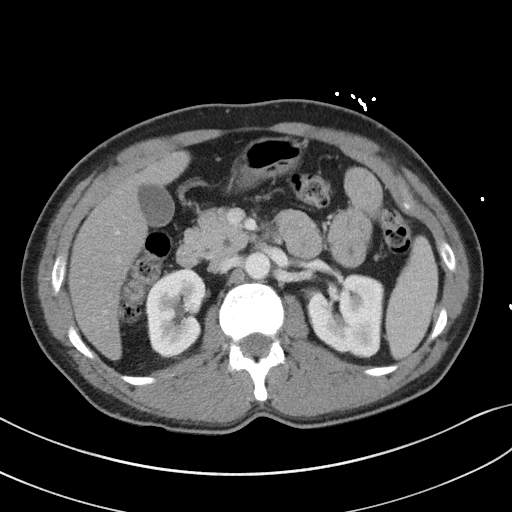
[im 67/98  bone]
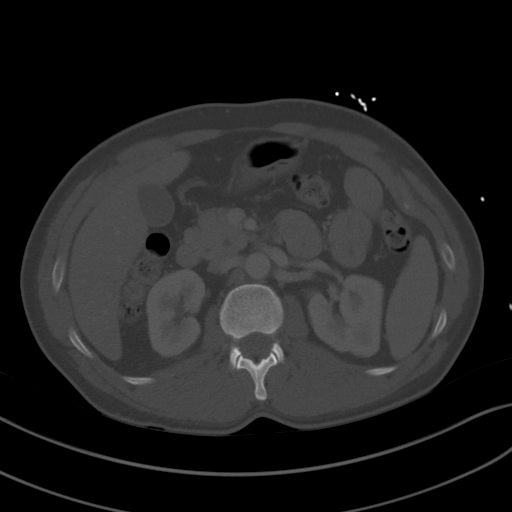
[im 77/98  soft-tissue]
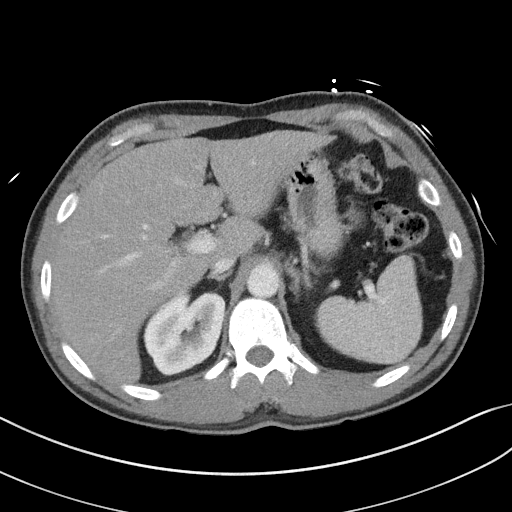
[im 82/98  soft-tissue]
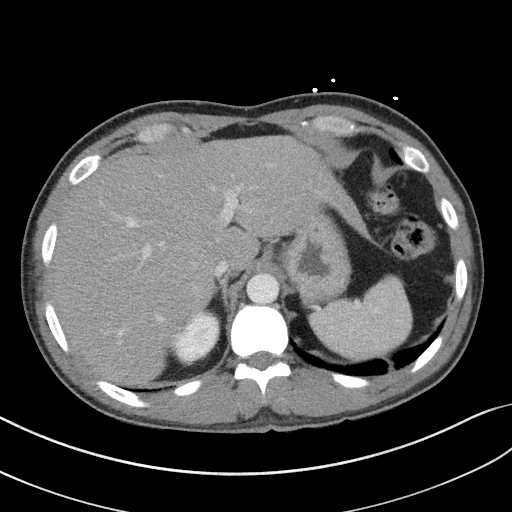
[im 92/98  soft-tissue]
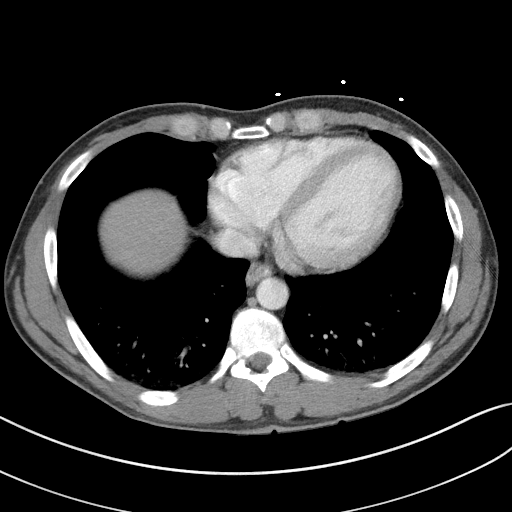

[Series 6: coronal st · coronal · 0.70mm/px · 3 of 79 slices shown]
[im 27/79  soft-tissue]
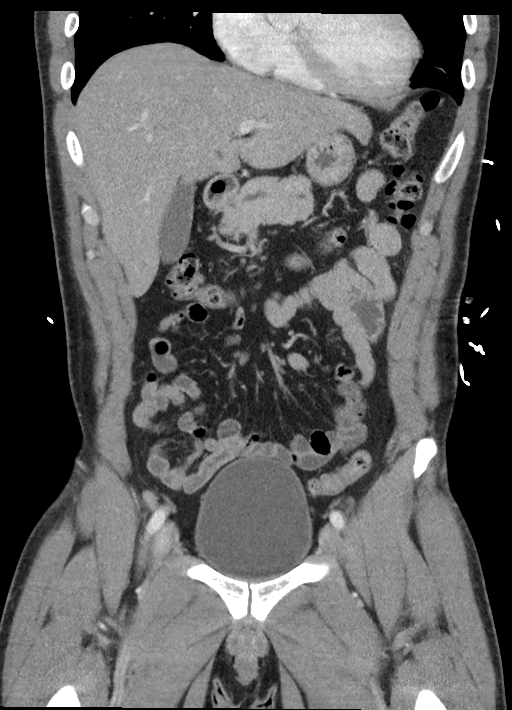
[im 35/79  soft-tissue]
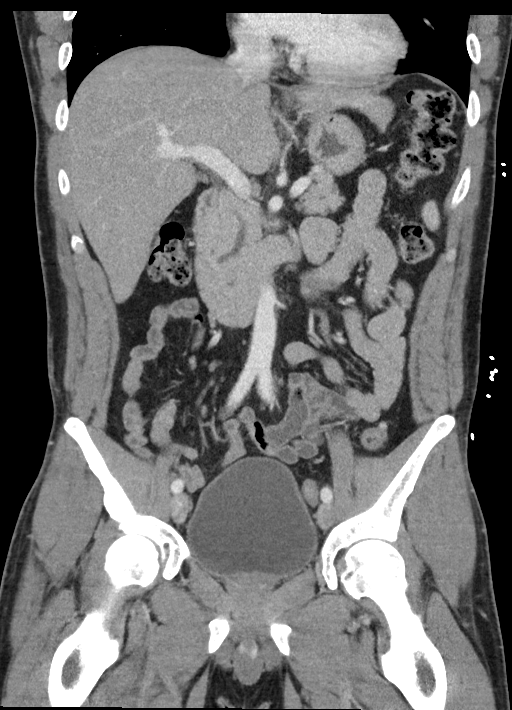
[im 44/79  soft-tissue]
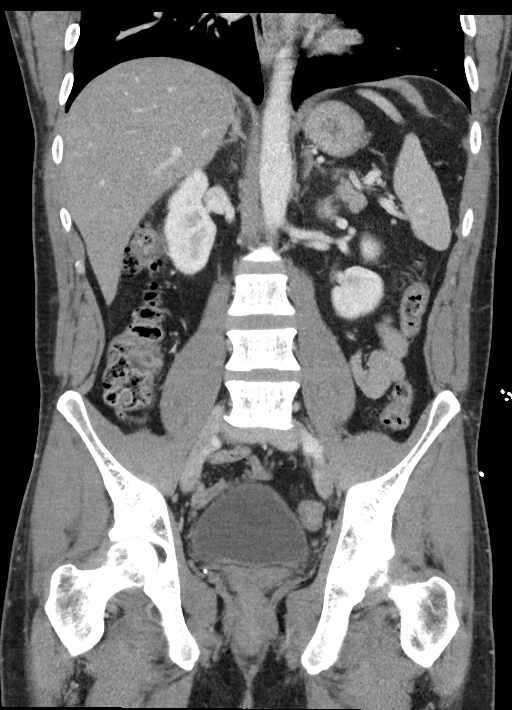

[15 of 46 positions shown; findings below may reference images not displayed]

FINDINGS: Lower chest: The lung bases are clear without focal nodule, mass, or
airspace disease. Mild dependent atelectasis is similar to the prior
studies. Heart size is normal. No significant pleural or pericardial
effusion is present.

Hepatobiliary: No focal liver abnormality is seen. No gallstones,
gallbladder wall thickening, or biliary dilatation.

Pancreas: Proximal pancreatic duct dilation is stable. No discrete
lesions are present.

Spleen: Normal in size without focal abnormality.

Adrenals/Urinary Tract: The adrenal glands are normal bilaterally.
Kidneys and ureters are within normal limits. The urinary bladder is
within normal limits.

Stomach/Bowel: The stomach and duodenum are within normal limits.
Small bowel is unremarkable. The terminal ileum is identified. Is
within normal limits. The appendix is retrocecal. The ascending and
transverse colon are within normal limits. Descending and sigmoid
colon are normal.

Vascular/Lymphatic: No significant vascular findings are present. No
enlarged abdominal or pelvic lymph nodes.

Reproductive: Prostate gland is within normal limits.

Other: No abdominal wall hernia or abnormality. No abdominopelvic
ascites.

Musculoskeletal: Bilateral L5 pars defects are present. 6 mm
anterolisthesis is present at L5-S1. Moderate foraminal stenosis is
present bilaterally. Vertebral body heights and alignment are
maintained. Bony pelvis is intact.
IMPRESSION: 1. No acute or focal trauma to the abdomen.
2. Chronic dilation of pancreatic duct without significant stenosis.
3. Bilateral L5 pars defects.
4. Grade 1 anterolisthesis at L5-S1.
